# Patient Record
Sex: Female | Born: 1945 | Race: White | Hispanic: No | Smoking: Never smoker
Health system: Southern US, Community
[De-identification: ages and names within clinical notes are randomized; demographics above are authoritative.]

## PROBLEM LIST (undated history)

## (undated) DIAGNOSIS — E785 Hyperlipidemia, unspecified: Secondary | ICD-10-CM

## (undated) DIAGNOSIS — F419 Anxiety disorder, unspecified: Secondary | ICD-10-CM

## (undated) HISTORY — PX: LAPAROSCOPIC HYSTERECTOMY: SHX1926

## (undated) HISTORY — PX: OTHER SURGICAL HISTORY: SHX169

## (undated) HISTORY — PX: COLONOSCOPY: SHX174

## (undated) HISTORY — PX: BLADDER SUSPENSION: SHX72

---

## 2004-02-21 ENCOUNTER — Ambulatory Visit: Payer: Self-pay | Admitting: Internal Medicine

## 2005-02-23 ENCOUNTER — Ambulatory Visit: Payer: Self-pay | Admitting: Internal Medicine

## 2005-10-12 ENCOUNTER — Ambulatory Visit: Payer: Self-pay | Admitting: Unknown Physician Specialty

## 2006-03-09 ENCOUNTER — Ambulatory Visit: Payer: Self-pay | Admitting: Obstetrics and Gynecology

## 2007-04-12 ENCOUNTER — Ambulatory Visit: Payer: Self-pay | Admitting: Obstetrics and Gynecology

## 2008-04-12 ENCOUNTER — Ambulatory Visit: Payer: Self-pay | Admitting: Obstetrics and Gynecology

## 2009-05-13 ENCOUNTER — Ambulatory Visit: Payer: Self-pay | Admitting: Obstetrics and Gynecology

## 2010-07-25 ENCOUNTER — Ambulatory Visit: Payer: Self-pay | Admitting: Obstetrics and Gynecology

## 2011-07-31 ENCOUNTER — Ambulatory Visit: Payer: Self-pay | Admitting: Obstetrics and Gynecology

## 2012-07-06 ENCOUNTER — Ambulatory Visit: Payer: Self-pay | Admitting: Ophthalmology

## 2012-07-27 ENCOUNTER — Ambulatory Visit: Payer: Self-pay | Admitting: Ophthalmology

## 2012-08-01 ENCOUNTER — Ambulatory Visit: Payer: Self-pay | Admitting: Internal Medicine

## 2013-08-02 ENCOUNTER — Ambulatory Visit: Payer: Self-pay | Admitting: Internal Medicine

## 2014-08-06 ENCOUNTER — Ambulatory Visit: Payer: Self-pay | Admitting: Internal Medicine

## 2014-08-31 NOTE — Op Note (Signed)
PATIENT NAME:  Sharon Pittman, Sharon Pittman MR#:  [id number] DATE OF BIRTH:  09/29/1945  DATE OF PROCEDURE:  07/27/2012  PREOPERATIVE DIAGNOSIS:  Senile cataract, left eye.  POSTOPERATIVE DIAGNOSIS:  Senile cataract, left eye.  PROCEDURE:  Phacoemulsification with posterior chamber intraocular lens implantation of the left eye.  LENS:  ZCBOO 22.5-diopter posterior chamber intraocular lens.  ULTRASOUND TIME:  17 % of 1 minute, 7 seconds.  CDE 11.6.  SURGEON:  Mali Geniece Akers, MD  ANESTHESIA:  Topical with tetracaine drops and 2% Xylocaine jelly.  COMPLICATIONS:  None.  DESCRIPTION OF PROCEDURE:  The patient was identified in the holding room and transported to the operating room and placed in the supine position under the operating microscope.  The left eye was identified as the operative eye and it was prepped and draped in the usual sterile ophthalmic fashion.  A 1 millimeter clear-corneal paracentesis was made at the 3 o'clock position.  The anterior chamber was filled with Viscoat viscoelastic.  A 2.4 millimeter keratome was used to make a near-clear corneal incision at the 12 o'clock position.  A curvilinear capsulorrhexis was made with a cystotome and capsulorrhexis forceps.  Balanced salt solution was used to hydrodissect and hydrodelineate the nucleus.  Phacoemulsification was then used in stop and chop fashion to remove the lens nucleus and epinucleus.  The remaining cortex was then removed using the irrigation and aspiration handpiece. Provisc was then placed into the capsular bag to distend it for lens placement.  A ZCBOO 22.5-diopter lens was then injected into the capsular bag.  The remaining viscoelastic was aspirated.  Wounds were hydrated with balanced salt solution.  The anterior chamber was inflated to a physiologic pressure with balanced salt solution.  0.1 mL of cefuroxime 10 mg/mL were injected into the anterior chamber for a dose of 1 mg of intracameral antibiotic at the completion  of the case. Miostat was placed into the anterior chamber to constrict the pupil.  No wound leaks were noted.  Topical Vigamox drops and Maxitrol ointment were applied to the eye.  The patient was taken to the recovery room in stable condition without complications of anesthesia or surgery.     ____________________________ Wyonia Hough, MD crb:lg D: 07/27/2012 10:44:57 ET T: 07/27/2012 11:07:42 ET JOB#: [id number]  cc: Wyonia Hough, MD, <Dictator>

## 2015-03-25 ENCOUNTER — Other Ambulatory Visit: Payer: Self-pay | Admitting: Internal Medicine

## 2015-03-25 DIAGNOSIS — Z1231 Encounter for screening mammogram for malignant neoplasm of breast: Secondary | ICD-10-CM

## 2015-08-07 ENCOUNTER — Ambulatory Visit: Payer: Self-pay

## 2015-08-08 ENCOUNTER — Ambulatory Visit
Admission: RE | Admit: 2015-08-08 | Discharge: 2015-08-08 | Disposition: A | Discharge status: Home / Self Care | Payer: Medicare Other | Source: Ambulatory Visit | Attending: Internal Medicine | Admitting: Internal Medicine

## 2015-08-08 DIAGNOSIS — Z1231 Encounter for screening mammogram for malignant neoplasm of breast: Secondary | ICD-10-CM

## 2015-11-05 ENCOUNTER — Encounter: Payer: Self-pay | Admitting: *Deleted

## 2015-11-06 ENCOUNTER — Ambulatory Visit
Admission: RE | Admit: 2015-11-06 | Discharge: 2015-11-06 | Disposition: A | Discharge status: Home / Self Care | Payer: Medicare Other | Source: Ambulatory Visit | Attending: Unknown Physician Specialty | Admitting: Unknown Physician Specialty

## 2015-11-06 ENCOUNTER — Encounter: Payer: Self-pay | Admitting: *Deleted

## 2015-11-06 ENCOUNTER — Ambulatory Visit: Payer: Medicare Other | Admitting: Anesthesiology

## 2015-11-06 ENCOUNTER — Encounter
Admission: RE | Disposition: A | Discharge status: Home / Self Care | Payer: Self-pay | Source: Ambulatory Visit | Attending: Unknown Physician Specialty

## 2015-11-06 DIAGNOSIS — F419 Anxiety disorder, unspecified: Secondary | ICD-10-CM | POA: Diagnosis not present

## 2015-11-06 DIAGNOSIS — K621 Rectal polyp: Secondary | ICD-10-CM | POA: Insufficient documentation

## 2015-11-06 DIAGNOSIS — K640 First degree hemorrhoids: Secondary | ICD-10-CM | POA: Insufficient documentation

## 2015-11-06 DIAGNOSIS — Z79899 Other long term (current) drug therapy: Secondary | ICD-10-CM | POA: Diagnosis not present

## 2015-11-06 DIAGNOSIS — D125 Benign neoplasm of sigmoid colon: Secondary | ICD-10-CM | POA: Diagnosis not present

## 2015-11-06 DIAGNOSIS — Z7951 Long term (current) use of inhaled steroids: Secondary | ICD-10-CM | POA: Insufficient documentation

## 2015-11-06 DIAGNOSIS — Z9889 Other specified postprocedural states: Secondary | ICD-10-CM | POA: Diagnosis not present

## 2015-11-06 DIAGNOSIS — Z87448 Personal history of other diseases of urinary system: Secondary | ICD-10-CM | POA: Diagnosis not present

## 2015-11-06 DIAGNOSIS — Z1211 Encounter for screening for malignant neoplasm of colon: Secondary | ICD-10-CM | POA: Insufficient documentation

## 2015-11-06 DIAGNOSIS — K5730 Diverticulosis of large intestine without perforation or abscess without bleeding: Secondary | ICD-10-CM | POA: Insufficient documentation

## 2015-11-06 DIAGNOSIS — Z803 Family history of malignant neoplasm of breast: Secondary | ICD-10-CM | POA: Insufficient documentation

## 2015-11-06 HISTORY — DX: Hyperlipidemia, unspecified: E78.5

## 2015-11-06 HISTORY — DX: Anxiety disorder, unspecified: F41.9

## 2015-11-06 HISTORY — PX: COLONOSCOPY WITH PROPOFOL: SHX5780

## 2015-11-06 SURGERY — COLONOSCOPY WITH PROPOFOL
Anesthesia: General

## 2015-11-06 MED ORDER — MIDAZOLAM HCL 5 MG/5ML IJ SOLN
INTRAMUSCULAR | Status: DC | PRN
  Administered 2015-11-06: 1 mg via INTRAVENOUS

## 2015-11-06 MED ORDER — SODIUM CHLORIDE 0.9 % IV SOLN
INTRAVENOUS | Status: DC | PRN
  Administered 2015-11-06: 08:00:00 via INTRAVENOUS

## 2015-11-06 MED ORDER — PROPOFOL 500 MG/50ML IV EMUL
INTRAVENOUS | Status: DC | PRN
  Administered 2015-11-06: 160 ug/kg/min via INTRAVENOUS

## 2015-11-06 MED ORDER — LIDOCAINE 2% (20 MG/ML) 5 ML SYRINGE
INTRAMUSCULAR | Status: DC | PRN
  Administered 2015-11-06: 40 mg via INTRAVENOUS

## 2015-11-06 MED ORDER — GLYCOPYRROLATE 0.2 MG/ML IJ SOLN
INTRAMUSCULAR | Status: DC | PRN
  Administered 2015-11-06: 0.2 mg via INTRAVENOUS

## 2015-11-06 MED ORDER — FENTANYL CITRATE (PF) 100 MCG/2ML IJ SOLN
INTRAMUSCULAR | Status: DC | PRN
  Administered 2015-11-06: 50 ug via INTRAVENOUS

## 2015-11-06 MED ORDER — PROPOFOL 10 MG/ML IV BOLUS
INTRAVENOUS | Status: DC | PRN
  Administered 2015-11-06: 100 mg via INTRAVENOUS

## 2015-11-06 MED ORDER — SODIUM CHLORIDE 0.9 % IV SOLN: INTRAVENOUS | Status: DC

## 2015-11-06 MED ORDER — PHENYLEPHRINE HCL 10 MG/ML IJ SOLN
INTRAMUSCULAR | Status: DC | PRN
  Administered 2015-11-06 (×2): 100 ug via INTRAVENOUS

## 2015-11-06 NOTE — H&P (Signed)
   Primary Care Physician:  Rusty Aus, MD Primary Gastroenterologist:  Dr. Vira Agar  Pre-Procedure History & Physical: HPI:  Sharon Pittman is a 70 y.o. female is here for an colonoscopy.   Past Medical History  Diagnosis Date  . Anxiety   . Elevated lipids     Past Surgical History  Procedure Laterality Date  . Uretheral polyp      excision/ fulguration   . Colonoscopy      Prior to Admission medications   Medication Sig Start Date End Date Taking? Authorizing Provider  ALPRAZolam Duanne Moron) 0.25 MG tablet Take 0.25 mg by mouth at bedtime as needed for anxiety.   Yes Historical Provider, MD  calcium carbonate (OSCAL) 1500 (600 Ca) MG TABS tablet Take 1 tablet by mouth daily with breakfast.   Yes Historical Provider, MD  chlorthalidone (HYGROTON) 25 MG tablet Take 25 mg by mouth daily.   Yes Historical Provider, MD  fexofenadine (ALLEGRA) 180 MG tablet Take 180 mg by mouth daily.   Yes Historical Provider, MD  hydrocortisone (ANUSOL-HC) 25 MG suppository Place 25 mg rectally 2 (two) times daily.   Yes Historical Provider, MD  ibandronate (BONIVA) 150 MG tablet Take 150 mg by mouth every 30 (thirty) days. Take in the morning with a full glass of water, on an empty stomach, and do not take anything else by mouth or lie down for the next 30 min.   Yes Historical Provider, MD  mometasone (NASONEX) 50 MCG/ACT nasal spray Place 2 sprays into the nose daily.   Yes Historical Provider, MD    Allergies as of 10/17/2015  . (Not on File)    Family History  Problem Relation Age of Onset  . Breast cancer Maternal Grandmother 88    Social History   Social History  . Marital Status: Divorced    Spouse Name: N/A  . Number of Children: N/A  . Years of Education: N/A   Occupational History  . Not on file.   Social History Main Topics  . Smoking status: Never Smoker   . Smokeless tobacco: Not on file  . Alcohol Use: Not on file  . Drug Use: Not on file  . Sexual Activity: Not on  file   Other Topics Concern  . Not on file   Social History Narrative    Review of Systems: See HPI, otherwise negative ROS  Physical Exam: BP 140/78 mmHg  Pulse 76  Temp(Src) 98 F (36.7 C) (Tympanic)  Resp 18  Ht 5\' 3"  (1.6 m)  Wt 55.792 kg (123 lb)  BMI 21.79 kg/m2  SpO2 100% General:   Alert,  pleasant and cooperative in NAD Head:  Normocephalic and atraumatic. Neck:  Supple; no masses or thyromegaly. Lungs:  Clear throughout to auscultation.    Heart:  Regular rate and rhythm. Abdomen:  Soft, nontender and nondistended. Normal bowel sounds, without guarding, and without rebound.   Neurologic:  Alert and  oriented x4;  grossly normal neurologically.  Impression/Plan: Sharon Pittman is here for an colonoscopy to be performed for screening colonoscopy  Risks, benefits, limitations, and alternatives regarding  colonoscopy have been reviewed with the patient.  Questions have been answered.  All parties agreeable.   Gaylyn Cheers, MD  11/06/2015, 8:04 AM

## 2015-11-06 NOTE — Anesthesia Postprocedure Evaluation (Signed)
Anesthesia Post Note  Patient: Sharon Pittman  Procedure(s) Performed: Procedure(s) (LRB): COLONOSCOPY WITH PROPOFOL (N/A)  Patient location during evaluation: PACU Anesthesia Type: General Level of consciousness: awake and alert Pain management: pain level controlled Vital Signs Assessment: post-procedure vital signs reviewed and stable Respiratory status: spontaneous breathing, nonlabored ventilation, respiratory function stable and patient connected to nasal cannula oxygen Cardiovascular status: blood pressure returned to baseline and stable Postop Assessment: no signs of nausea or vomiting Anesthetic complications: no    Last Vitals:  Filed Vitals:   11/06/15 0900 11/06/15 0910  BP: 123/84 125/69  Pulse: 62 57  Temp:    Resp: 21 14    Last Pain: There were no vitals filed for this visit.               Molli Barrows

## 2015-11-06 NOTE — Op Note (Signed)
[facility] Gastroenterology Patient Name: Sharon Pittman Procedure Date: 11/06/2015 8:10 AM MRN: [id number] Account #: [id number] Date of Birth: 1946/01/15 Admit Type: Outpatient Age: 70 Room: [facility] ENDO ROOM 3 Gender: Female Note Status: Finalized Procedure:            Colonoscopy Indications:          Screening for colorectal malignant neoplasm Providers:            Manya Silvas, MD Referring MD:         Rusty Aus, MD (Referring MD) Medicines:            Propofol per Anesthesia Complications:        No immediate complications. Procedure:            Pre-Anesthesia Assessment:                       - After reviewing the risks and benefits, the patient                        was deemed in satisfactory condition to undergo the                        procedure.                       After obtaining informed consent, the colonoscope was                        passed under direct vision. Throughout the procedure,                        the patient's blood pressure, pulse, and oxygen                        saturations were monitored continuously. The                        Colonoscope was introduced through the anus and                        advanced to the the cecum, identified by appendiceal                        orifice and ileocecal valve. The colonoscopy was                        performed without difficulty. The patient tolerated the                        procedure well. The quality of the bowel preparation                        was excellent. Findings:      Five sessile polyps were found in the rectum. The polyps were diminutive       in size. These polyps were removed with a jumbo cold forceps. Resection       and retrieval were complete.      A small polyp was found in the sigmoid colon. The polyp was sessile. The       polyp was removed with a hot snare. Resection and retrieval were  complete.      The exam was otherwise without  abnormality.      Multiple small and large-mouthed diverticula were found in the sigmoid       colon, descending colon, transverse colon and ascending colon.      Internal hemorrhoids were found during endoscopy. The hemorrhoids were       small and Grade I (internal hemorrhoids that do not prolapse). Impression:           - Five diminutive polyps in the rectum, removed with a                        jumbo cold forceps. Resected and retrieved.                       - One small polyp in the sigmoid colon, removed with a                        hot snare. Resected and retrieved.                       - The examination was otherwise normal. Recommendation:       - Await pathology results. Manya Silvas, MD 11/06/2015 8:49:07 AM This report has been signed electronically. Number of Addenda: 0 Note Initiated On: 11/06/2015 8:10 AM Scope Withdrawal Time: 0 hours 17 minutes 58 seconds  Total Procedure Duration: 0 hours 25 minutes 21 seconds       [facility]

## 2015-11-06 NOTE — Transfer of Care (Signed)
Immediate Anesthesia Transfer of Care Note  Patient: Sharon Pittman  Procedure(s) Performed: Procedure(s): COLONOSCOPY WITH PROPOFOL (N/A)  Patient Location: PACU and Endoscopy Unit  Anesthesia Type:General  Level of Consciousness: awake, oriented and patient cooperative  Airway & Oxygen Therapy: Patient Spontanous Breathing and Patient connected to nasal cannula oxygen  Post-op Assessment: Report given to RN and Post -op Vital signs reviewed and stable  Post vital signs: stable  Last Vitals:  Filed Vitals:   11/06/15 0751  BP: 140/78  Pulse: 76  Temp: 36.7 C  Resp: 18    Last Pain: There were no vitals filed for this visit.       Complications: No apparent anesthesia complications

## 2015-11-06 NOTE — Anesthesia Preprocedure Evaluation (Signed)
Anesthesia Evaluation  Patient identified by MRN, date of birth, ID band Patient awake    Reviewed: Allergy & Precautions, H&P , NPO status , Patient's Chart, lab work & pertinent test results, reviewed documented beta blocker date and time   Airway Mallampati: II   Neck ROM: full    Dental  (+) Poor Dentition   Pulmonary neg pulmonary ROS,    Pulmonary exam normal        Cardiovascular negative cardio ROS Normal cardiovascular exam     Neuro/Psych negative neurological ROS  negative psych ROS   GI/Hepatic negative GI ROS, Neg liver ROS,   Endo/Other  negative endocrine ROS  Renal/GU negative Renal ROS  negative genitourinary   Musculoskeletal   Abdominal   Peds  Hematology negative hematology ROS (+)   Anesthesia Other Findings Past Medical History:   Anxiety                                                      Elevated lipids                                            Past Surgical History:   uretheral polyp                                                 Comment:excision/ fulguration    Reproductive/Obstetrics                             Anesthesia Physical Anesthesia Plan  ASA: III  Anesthesia Plan: General   Post-op Pain Management:    Induction:   Airway Management Planned:   Additional Equipment:   Intra-op Plan:   Post-operative Plan:   Informed Consent: I have reviewed the patients History and Physical, chart, labs and discussed the procedure including the risks, benefits and alternatives for the proposed anesthesia with the patient or authorized representative who has indicated his/her understanding and acceptance.   Dental Advisory Given  Plan Discussed with: CRNA  Anesthesia Plan Comments:         Anesthesia Quick Evaluation

## 2015-11-07 LAB — SURGICAL PATHOLOGY

## 2015-11-08 ENCOUNTER — Encounter: Payer: Self-pay | Admitting: Unknown Physician Specialty

## 2016-04-08 ENCOUNTER — Other Ambulatory Visit: Payer: Self-pay | Admitting: Internal Medicine

## 2016-04-08 DIAGNOSIS — Z1231 Encounter for screening mammogram for malignant neoplasm of breast: Secondary | ICD-10-CM

## 2016-08-11 ENCOUNTER — Ambulatory Visit
Admission: RE | Admit: 2016-08-11 | Discharge: 2016-08-11 | Disposition: A | Discharge status: Home / Self Care | Payer: Medicare Other | Source: Ambulatory Visit | Attending: Internal Medicine | Admitting: Internal Medicine

## 2016-08-11 DIAGNOSIS — Z1231 Encounter for screening mammogram for malignant neoplasm of breast: Secondary | ICD-10-CM | POA: Diagnosis not present

## 2017-07-05 ENCOUNTER — Other Ambulatory Visit: Payer: Self-pay | Admitting: Internal Medicine

## 2017-07-05 DIAGNOSIS — Z1231 Encounter for screening mammogram for malignant neoplasm of breast: Secondary | ICD-10-CM

## 2017-08-12 ENCOUNTER — Ambulatory Visit
Admission: RE | Admit: 2017-08-12 | Discharge: 2017-08-12 | Disposition: A | Discharge status: Home / Self Care | Payer: Medicare Other | Source: Ambulatory Visit | Attending: Internal Medicine | Admitting: Internal Medicine

## 2017-08-12 DIAGNOSIS — Z1231 Encounter for screening mammogram for malignant neoplasm of breast: Secondary | ICD-10-CM | POA: Insufficient documentation

## 2017-08-12 DIAGNOSIS — R928 Other abnormal and inconclusive findings on diagnostic imaging of breast: Secondary | ICD-10-CM | POA: Diagnosis not present

## 2017-08-16 ENCOUNTER — Other Ambulatory Visit: Payer: Self-pay | Admitting: Internal Medicine

## 2017-08-16 DIAGNOSIS — N6489 Other specified disorders of breast: Secondary | ICD-10-CM

## 2017-08-16 DIAGNOSIS — R928 Other abnormal and inconclusive findings on diagnostic imaging of breast: Secondary | ICD-10-CM

## 2017-08-31 ENCOUNTER — Ambulatory Visit
Admission: RE | Admit: 2017-08-31 | Discharge: 2017-08-31 | Disposition: A | Discharge status: Home / Self Care | Payer: Medicare Other | Source: Ambulatory Visit | Attending: Internal Medicine | Admitting: Internal Medicine

## 2017-08-31 DIAGNOSIS — R928 Other abnormal and inconclusive findings on diagnostic imaging of breast: Secondary | ICD-10-CM

## 2017-08-31 DIAGNOSIS — N6489 Other specified disorders of breast: Secondary | ICD-10-CM | POA: Insufficient documentation

## 2018-03-03 ENCOUNTER — Observation Stay
Admission: EM | Admit: 2018-03-03 | Discharge: 2018-03-04 | Disposition: A | Discharge status: Home / Self Care | Payer: Medicare Other | Attending: General Surgery | Admitting: General Surgery

## 2018-03-03 ENCOUNTER — Other Ambulatory Visit: Payer: Self-pay

## 2018-03-03 ENCOUNTER — Ambulatory Visit
Admission: RE | Admit: 2018-03-03 | Discharge: 2018-03-03 | Disposition: A | Discharge status: Home / Self Care | Payer: Medicare Other | Source: Ambulatory Visit | Attending: Family Medicine | Admitting: Family Medicine

## 2018-03-03 ENCOUNTER — Other Ambulatory Visit: Payer: Self-pay | Admitting: Family Medicine

## 2018-03-03 DIAGNOSIS — R1031 Right lower quadrant pain: Secondary | ICD-10-CM

## 2018-03-03 DIAGNOSIS — D72829 Elevated white blood cell count, unspecified: Secondary | ICD-10-CM | POA: Insufficient documentation

## 2018-03-03 DIAGNOSIS — I700 Atherosclerosis of aorta: Secondary | ICD-10-CM | POA: Insufficient documentation

## 2018-03-03 DIAGNOSIS — F419 Anxiety disorder, unspecified: Secondary | ICD-10-CM | POA: Diagnosis not present

## 2018-03-03 DIAGNOSIS — K5740 Diverticulitis of both small and large intestine with perforation and abscess without bleeding: Secondary | ICD-10-CM

## 2018-03-03 DIAGNOSIS — R509 Fever, unspecified: Secondary | ICD-10-CM

## 2018-03-03 DIAGNOSIS — Z7951 Long term (current) use of inhaled steroids: Secondary | ICD-10-CM | POA: Insufficient documentation

## 2018-03-03 DIAGNOSIS — R1032 Left lower quadrant pain: Principal | ICD-10-CM

## 2018-03-03 DIAGNOSIS — K5720 Diverticulitis of large intestine with perforation and abscess without bleeding: Secondary | ICD-10-CM | POA: Diagnosis not present

## 2018-03-03 DIAGNOSIS — K5780 Diverticulitis of intestine, part unspecified, with perforation and abscess without bleeding: Secondary | ICD-10-CM | POA: Diagnosis present

## 2018-03-03 DIAGNOSIS — K439 Ventral hernia without obstruction or gangrene: Secondary | ICD-10-CM

## 2018-03-03 DIAGNOSIS — Z79899 Other long term (current) drug therapy: Secondary | ICD-10-CM | POA: Insufficient documentation

## 2018-03-03 DIAGNOSIS — K5732 Diverticulitis of large intestine without perforation or abscess without bleeding: Secondary | ICD-10-CM | POA: Insufficient documentation

## 2018-03-03 LAB — COMPREHENSIVE METABOLIC PANEL
ALBUMIN: 3.6 g/dL (ref 3.5–5.0)
ALK PHOS: 73 U/L (ref 38–126)
ALT: 23 U/L (ref 0–44)
AST: 29 U/L (ref 15–41)
Anion gap: 8 (ref 5–15)
BILIRUBIN TOTAL: 0.6 mg/dL (ref 0.3–1.2)
BUN: 9 mg/dL (ref 8–23)
CALCIUM: 8.5 mg/dL — AB (ref 8.9–10.3)
CO2: 26 mmol/L (ref 22–32)
Chloride: 100 mmol/L (ref 98–111)
Creatinine, Ser: 0.63 mg/dL (ref 0.44–1.00)
GFR calc Af Amer: >60 mL/min (ref >60)
GLUCOSE: 104 mg/dL — AB (ref 70–99)
POTASSIUM: 3.6 mmol/L (ref 3.5–5.1)
Sodium: 134 mmol/L — ABNORMAL LOW (ref 135–145)
TOTAL PROTEIN: 6.7 g/dL (ref 6.5–8.1)

## 2018-03-03 LAB — CBC WITH DIFFERENTIAL/PLATELET
ABS IMMATURE GRANULOCYTES: 0.14 10*3/uL — AB (ref 0.00–0.07)
BASOS ABS: 0.1 10*3/uL (ref 0.0–0.1)
BASOS PCT: 1 %
Eosinophils Absolute: 0.1 10*3/uL (ref 0.0–0.5)
Eosinophils Relative: 1 %
HCT: 41.5 % (ref 36.0–46.0)
Hemoglobin: 13.5 g/dL (ref 12.0–15.0)
IMMATURE GRANULOCYTES: 1 %
Lymphocytes Relative: 15 %
Lymphs Abs: 1.9 10*3/uL (ref 0.7–4.0)
MCH: 28.8 pg (ref 26.0–34.0)
MCHC: 32.5 g/dL (ref 30.0–36.0)
MCV: 88.7 fL (ref 80.0–100.0)
Monocytes Absolute: 1.3 10*3/uL — ABNORMAL HIGH (ref 0.1–1.0)
Monocytes Relative: 10 %
NEUTROS ABS: 9.4 10*3/uL — AB (ref 1.7–7.7)
NEUTROS PCT: 72 %
NRBC: 0 % (ref 0.0–0.2)
Platelets: 264 10*3/uL (ref 150–400)
RBC: 4.68 MIL/uL (ref 3.87–5.11)
RDW: 13.9 % (ref 11.5–15.5)
WBC: 12.9 10*3/uL — AB (ref 4.0–10.5)

## 2018-03-03 LAB — TROPONIN I: Troponin I: <0.03 ng/mL (ref <0.03)

## 2018-03-03 LAB — LACTIC ACID, PLASMA: Lactic Acid, Venous: 0.8 mmol/L (ref 0.5–1.9)

## 2018-03-03 MED ORDER — ALPRAZOLAM 0.5 MG PO TABS: 0.2500 mg | ORAL_TABLET | Freq: Every evening | ORAL | Status: DC | PRN

## 2018-03-03 MED ORDER — PIPERACILLIN-TAZOBACTAM 3.375 G IVPB: 3.3750 g | Freq: Three times a day (TID) | INTRAVENOUS | Status: DC

## 2018-03-03 MED ORDER — PANTOPRAZOLE SODIUM 20 MG PO TBEC
20.0000 mg | DELAYED_RELEASE_TABLET | Freq: Every day | ORAL | Status: DC
  Administered 2018-03-03 – 2018-03-04 (×2): 20 mg via ORAL
  Filled 2018-03-03 (×2): qty 1

## 2018-03-03 MED ORDER — ENOXAPARIN SODIUM 40 MG/0.4ML ~~LOC~~ SOLN
40.0000 mg | SUBCUTANEOUS | Status: DC
  Administered 2018-03-03: 40 mg via SUBCUTANEOUS
  Filled 2018-03-03: qty 0.4

## 2018-03-03 MED ORDER — SODIUM CHLORIDE 0.9 % IV SOLN
INTRAVENOUS | Status: DC
  Administered 2018-03-03: 22:00:00 via INTRAVENOUS

## 2018-03-03 MED ORDER — METRONIDAZOLE IN NACL 5-0.79 MG/ML-% IV SOLN
500.0000 mg | Freq: Three times a day (TID) | INTRAVENOUS | Status: DC
  Filled 2018-03-03: qty 100

## 2018-03-03 MED ORDER — ACETAMINOPHEN 325 MG PO TABS: 650.0000 mg | ORAL_TABLET | Freq: Four times a day (QID) | ORAL | Status: DC | PRN

## 2018-03-03 MED ORDER — RISAQUAD PO CAPS
1.0000 | ORAL_CAPSULE | Freq: Three times a day (TID) | ORAL | Status: DC
  Administered 2018-03-03 – 2018-03-04 (×2): 1 via ORAL
  Filled 2018-03-03 (×2): qty 1

## 2018-03-03 MED ORDER — ACETAMINOPHEN 650 MG RE SUPP: 650.0000 mg | Freq: Four times a day (QID) | RECTAL | Status: DC | PRN

## 2018-03-03 MED ORDER — CALCIUM CARBONATE ANTACID 500 MG PO CHEW
3.0000 | CHEWABLE_TABLET | Freq: Every day | ORAL | Status: DC
  Administered 2018-03-04: 600 mg via ORAL
  Filled 2018-03-03: qty 3

## 2018-03-03 MED ORDER — SODIUM CHLORIDE 0.9 % IV SOLN
2.0000 g | Freq: Once | INTRAVENOUS | Status: AC
  Administered 2018-03-03: 2 g via INTRAVENOUS
  Filled 2018-03-03: qty 2

## 2018-03-03 MED ORDER — SODIUM CHLORIDE 0.9 % IV SOLN: 2.0000 g | Freq: Two times a day (BID) | INTRAVENOUS | Status: DC

## 2018-03-03 MED ORDER — IOPAMIDOL (ISOVUE-300) INJECTION 61%
100.0000 mL | Freq: Once | INTRAVENOUS | Status: AC | PRN
  Administered 2018-03-03: 100 mL via INTRAVENOUS

## 2018-03-03 MED ORDER — CHLORTHALIDONE 25 MG PO TABS
25.0000 mg | ORAL_TABLET | ORAL | Status: DC
Start: 2018-03-03 — End: 2018-03-04
  Filled 2018-03-03: qty 1

## 2018-03-03 MED ORDER — SODIUM CHLORIDE 0.9 % IV BOLUS
1000.0000 mL | Freq: Once | INTRAVENOUS | Status: AC
  Administered 2018-03-03: 1000 mL via INTRAVENOUS

## 2018-03-03 MED ORDER — PIPERACILLIN-TAZOBACTAM 3.375 G IVPB
3.3750 g | Freq: Three times a day (TID) | INTRAVENOUS | Status: DC
  Administered 2018-03-03 – 2018-03-04 (×2): 3.375 g via INTRAVENOUS
  Filled 2018-03-03 (×2): qty 50

## 2018-03-03 MED ORDER — HYDROCODONE-ACETAMINOPHEN 5-325 MG PO TABS: 1.0000 | ORAL_TABLET | ORAL | Status: DC | PRN

## 2018-03-03 NOTE — Consult Note (Signed)
Pharmacy Antibiotic Note  Sharon Pittman is a 72 y.o. female admitted on 03/03/2018 with intra-abdominal infection.  Pharmacy has been consulted for cefepime dosing.  Plan: Cefepime 2 gm IV every 12 hours  Height: 5\' 3"  (160 cm) Weight: 127 lb (57.6 kg) IBW/kg (Calculated) : 52.4  Temp (24hrs), Avg:97.8 F (36.6 C), Min:97.8 F (36.6 C), Max:97.8 F (36.6 C)  Recent Labs  Lab 03/03/18 1821  WBC 12.9*  CREATININE 0.63  LATICACIDVEN 0.8    Estimated Creatinine Clearance: 52.6 mL/min (by C-G formula based on SCr of 0.63 mg/dL).    Allergies  Allergen Reactions  . Ace Inhibitors Cough    Antimicrobials this admission: Cefepime 10/24 >>  Metronidazole 10/24 >>   Dose adjustments this admission:   Microbiology results:  BCx:   UCx:    Sputum:    MRSA PCR:   Thank you for allowing pharmacy to be a part of this patient's care.  Forrest Moron, PharmD 03/03/2018 7:08 PM

## 2018-03-03 NOTE — ED Notes (Signed)
Sam RN, aware of bed assigned  

## 2018-03-03 NOTE — ED Notes (Signed)
Pt sent from CT with a bowel per formation and abscess confirmed with a CT today. Pt ambulatory and in NAD at time of arrival to [employer].

## 2018-03-03 NOTE — ED Provider Notes (Signed)
[facility] Emergency Department Provider Note    First MD Initiated Contact with Patient 03/03/18 1809     (approximate)  I have reviewed the triage vital signs and the nursing notes.   HISTORY  Chief Complaint Abdominal Pain    HPI Sharon Pittman is a 72 y.o. female presents the ER with outpatient CT imaging showing evidence of significant diverticulitis with perforation and surrounding colonic edema and inflammation.  Patient states she is been having low-grade temperatures for the past week associated with diarrheal illness.  No antibiotics.  Denies any anterior abdominal pain but does have some achiness and bloating sensation in the left lower quadrant.   Has not taken anything for pain today.   Past Medical History:  Diagnosis Date  . Anxiety   . Elevated lipids    Family History  Problem Relation Age of Onset  . Breast cancer Maternal Grandmother 88   Past Surgical History:  Procedure Laterality Date  . COLONOSCOPY    . COLONOSCOPY WITH PROPOFOL N/A 11/06/2015   Procedure: COLONOSCOPY WITH PROPOFOL;  Surgeon: Manya Silvas, MD;  Location: [facility] ENDOSCOPY;  Service: Endoscopy;  Laterality: N/A;  . uretheral polyp     excision/ fulguration    There are no active problems to display for this patient.     Prior to Admission medications   Medication Sig Start Date End Date Taking? Authorizing Provider  ALPRAZolam Duanne Moron) 0.25 MG tablet Take 0.25 mg by mouth at bedtime as needed for anxiety.    [provider]  calcium carbonate (OSCAL) 1500 (600 Ca) MG TABS tablet Take 1 tablet by mouth daily with breakfast.    [provider]  chlorthalidone (HYGROTON) 25 MG tablet Take 25 mg by mouth daily.    [provider]  fexofenadine (ALLEGRA) 180 MG tablet Take 180 mg by mouth daily.    [provider]  hydrocortisone (ANUSOL-HC) 25 MG suppository Place 25 mg rectally 2 (two) times daily.    [provider]  ibandronate (BONIVA) 150 MG tablet Take 150 mg by mouth every 30 (thirty) days. Take in the morning with a full glass of water, on an empty stomach, and do not take anything else by mouth or lie down for the next 30 min.    [provider]  mometasone (NASONEX) 50 MCG/ACT nasal spray Place 2 sprays into the nose daily.    [provider]    Allergies Ace inhibitors    Social History Social History   Tobacco Use  . Smoking status: Never Smoker  . Smokeless tobacco: Never Used  Substance Use Topics  . Alcohol use: Not Currently  . Drug use: Not Currently    Review of Systems Patient denies headaches, rhinorrhea, blurry vision, numbness, shortness of breath, chest pain, edema, cough, abdominal pain, nausea, vomiting, diarrhea, dysuria, fevers, rashes or hallucinations unless otherwise stated above in HPI. ____________________________________________   PHYSICAL EXAM:  VITAL SIGNS: Vitals:   03/03/18 1824 03/03/18 1825  BP:    Pulse: 76 78  Resp:    Temp:    SpO2: 100% 100%    Constitutional: Alert and oriented.  Eyes: Conjunctivae are normal.  Head: Atraumatic. Nose: No congestion/rhinnorhea. Mouth/Throat: Mucous membranes are moist.   Neck: No stridor. Painless ROM.  Cardiovascular: Normal rate, regular rhythm. Grossly normal heart sounds.  Good peripheral circulation. Respiratory: Normal respiratory effort.  No retractions. Lungs CTAB. Gastrointestinal: Soft and nontender. No distention. No abdominal bruits. No CVA tenderness.  Genitourinary:  Musculoskeletal: No lower extremity tenderness nor edema.  No joint effusions. Neurologic:  Normal speech and language. No gross focal neurologic deficits are appreciated. No facial droop Skin:  Skin is warm, dry and intact. No rash noted. Psychiatric: Mood and affect are normal. Speech and behavior are normal.  ____________________________________________   LABS (all labs ordered are  listed, but only abnormal results are displayed)  Results for orders placed or performed during the hospital encounter of 03/03/18 (from the past 24 hour(s))  CBC with Differential/Platelet     Status: Abnormal   Collection Time: 03/03/18  6:21 PM  Result Value Ref Range   WBC 12.9 (H) 4.0 - 10.5 K/uL   RBC 4.68 3.87 - 5.11 MIL/uL   Hemoglobin 13.5 12.0 - 15.0 g/dL   HCT 41.5 36.0 - 46.0 %   MCV 88.7 80.0 - 100.0 fL   MCH 28.8 26.0 - 34.0 pg   MCHC 32.5 30.0 - 36.0 g/dL   RDW 13.9 11.5 - 15.5 %   Platelets 264 150 - 400 K/uL   nRBC 0.0 0.0 - 0.2 %   Neutrophils Relative % 72 %   Neutro Abs 9.4 (H) 1.7 - 7.7 K/uL   Lymphocytes Relative 15 %   Lymphs Abs 1.9 0.7 - 4.0 K/uL   Monocytes Relative 10 %   Monocytes Absolute 1.3 (H) 0.1 - 1.0 K/uL   Eosinophils Relative 1 %   Eosinophils Absolute 0.1 0.0 - 0.5 K/uL   Basophils Relative 1 %   Basophils Absolute 0.1 0.0 - 0.1 K/uL   Immature Granulocytes 1 %   Abs Immature Granulocytes 0.14 (H) 0.00 - 0.07 K/uL  Comprehensive metabolic panel     Status: Abnormal   Collection Time: 03/03/18  6:21 PM  Result Value Ref Range   Sodium 134 (L) 135 - 145 mmol/L   Potassium 3.6 3.5 - 5.1 mmol/L   Chloride 100 98 - 111 mmol/L   CO2 26 22 - 32 mmol/L   Glucose, Bld 104 (H) 70 - 99 mg/dL   BUN 9 8 - 23 mg/dL   Creatinine, Ser 0.63 0.44 - 1.00 mg/dL   Calcium 8.5 (L) 8.9 - 10.3 mg/dL   Total Protein 6.7 6.5 - 8.1 g/dL   Albumin 3.6 3.5 - 5.0 g/dL   AST 29 15 - 41 U/L   ALT 23 0 - 44 U/L   Alkaline Phosphatase 73 38 - 126 U/L   Total Bilirubin 0.6 0.3 - 1.2 mg/dL   GFR calc non Af Amer >60 >60 mL/min   GFR calc Af Amer >60 >60 mL/min   Anion gap 8 5 - 15  Troponin I     Status: None   Collection Time: 03/03/18  6:21 PM  Result Value Ref Range   Troponin I <0.03 <0.03 ng/mL   ____________________________________________ ____________________________________________  RADIOLOGY  I personally reviewed all radiographic images ordered to  evaluate for the above acute complaints and reviewed radiology reports and findings.  These findings were personally discussed with the patient.  Please see medical record for radiology report.  ____________________________________________   PROCEDURES  Procedure(s) performed:  Procedures    Critical Care performed: no ____________________________________________   INITIAL IMPRESSION / ASSESSMENT AND PLAN / ED COURSE  Pertinent labs & imaging results that were available during my care of the patient were reviewed by me and considered in my medical decision making (see chart for details).   DDX: diverticulitis, abscess, perforation, pyelo  Sharon Pittman is  a 72 y.o. who presents to the ED with evidence of perforated diverticulitis.  Patient with leukocytosis.  Given perforation discussed case with Dr. Peyton Najjar of general surgery who kindly agrees to admit patient for further management.  Patient will receive prospect of IV antibiotics as well as IV fluids.      As part of my medical decision making, I reviewed the following data within the [place] notes reviewed and incorporated, Labs reviewed, notes from prior ED visits and [place] Controlled Substance Database   ____________________________________________   FINAL CLINICAL IMPRESSION(S) / ED DIAGNOSES  Final diagnoses:  Diverticulitis of both small and large intestine with perforation and abscess      NEW MEDICATIONS STARTED DURING THIS VISIT:  New Prescriptions   No medications on file     Note:  This document was prepared using Dragon voice recognition software and may include unintentional dictation errors.    Merlyn Lot, MD 03/03/18 [phone]

## 2018-03-03 NOTE — ED Notes (Signed)
Report given to John RN.

## 2018-03-03 NOTE — H&P (Signed)
SURGICAL HISTORY AND PHYSICAL EXAM NOTE   HISTORY OF PRESENT ILLNESS (HPI):  72 y.o. female presented to [facility] ED for evaluation of abdominal pain, nausea and low grade fever. Patient reports that she has been feeling abdominal discomfort for a week with some episodes of diarrhea. Pain on lower quadrants. Pain does not radiates. There is no aggravator or alleviator factors. Patient tried Gas X without improvement of symptoms. Patient went to work today but did not felt well and went to her PCP. CBC was ordered and was found with leukocytosis. CT scan was ordered and she was found with diverticulitis of sigmoid colon with small 14 mm collection. I personally reviewed the images. Denies nausea or vomiting. Last meal was breakfast today.  Surgery is consulted by Dr. Quentin Cornwall in this context for evaluation and management of acute diverticulitis.  PAST MEDICAL HISTORY (PMH):  Past Medical History:  Diagnosis Date  . Anxiety   . Elevated lipids      PAST SURGICAL HISTORY ([place]):  Past Surgical History:  Procedure Laterality Date  . COLONOSCOPY    . COLONOSCOPY WITH PROPOFOL N/A 11/06/2015   Procedure: COLONOSCOPY WITH PROPOFOL;  Surgeon: Manya Silvas, MD;  Location: [facility] ENDOSCOPY;  Service: Endoscopy;  Laterality: N/A;  . uretheral polyp     excision/ fulguration      MEDICATIONS:  Prior to Admission medications   Medication Sig Start Date End Date Taking? Authorizing Provider  calcium carbonate (OSCAL) 1500 (600 Ca) MG TABS tablet Take 1 tablet by mouth daily with breakfast.   Yes [provider]  chlorthalidone (HYGROTON) 25 MG tablet Take 25 mg by mouth 2 (two) times a week.    Yes [provider]  ALPRAZolam (XANAX) 0.25 MG tablet Take 0.25 mg by mouth at bedtime as needed for anxiety.    [provider]  mometasone (NASONEX) 50 MCG/ACT nasal spray Place 2 sprays into the nose daily.     [provider]     ALLERGIES:  Allergies  Allergen  Reactions  . Ace Inhibitors Cough     SOCIAL HISTORY:  Social History   Socioeconomic History  . Marital status: Divorced    Spouse name: Not on file  . Number of children: Not on file  . Years of education: Not on file  . Highest education level: Not on file  Occupational History  . Not on file  Social Needs  . Financial resource strain: Not on file  . Food insecurity:    Worry: Not on file    Inability: Not on file  . Transportation needs:    Medical: Not on file    Non-medical: Not on file  Tobacco Use  . Smoking status: Never Smoker  . Smokeless tobacco: Never Used  Substance and Sexual Activity  . Alcohol use: Not Currently  . Drug use: Not Currently  . Sexual activity: Not Currently  Lifestyle  . Physical activity:    Days per week: Not on file    Minutes per session: Not on file  . Stress: Not on file  Relationships  . Social connections:    Talks on phone: Not on file    Gets together: Not on file    Attends religious service: Not on file    Active member of club or organization: Not on file    Attends meetings of clubs or organizations: Not on file    Relationship status: Not on file  . Intimate partner violence:    Fear of  current or ex partner: Not on file    Emotionally abused: Not on file    Physically abused: Not on file    Forced sexual activity: Not on file  Other Topics Concern  . Not on file  Social History Narrative  . Not on file    The patient currently resides (home / rehab facility / nursing home): Home The patient normally is (ambulatory / bedbound): Ambulatory   FAMILY HISTORY:  Family History  Problem Relation Age of Onset  . Breast cancer Maternal Grandmother 88     REVIEW OF SYSTEMS:  Constitutional: denies weight loss, fever, chills, or sweats  Eyes: denies any other vision changes, history of eye injury  ENT: denies sore throat, hearing problems  Respiratory: denies shortness of breath, wheezing  Cardiovascular: denies  chest pain, palpitations  Gastrointestinal: see HPI Genitourinary: denies burning with urination or urinary frequency Musculoskeletal: denies any other joint pains or cramps  Skin: denies any other rashes or skin discolorations  Neurological: denies any other headache, dizziness, weakness  Psychiatric: denies any other depression, anxiety   All other review of systems were negative   VITAL SIGNS:  Temp:  [97.8 F (36.6 C)] 97.8 F (36.6 C) (10/24 1814) Pulse Rate:  [76-81] 78 (10/24 1825) Resp:  [17] 17 (10/24 1814) BP: (172)/(87) 172/87 (10/24 1814) SpO2:  [100 %] 100 % (10/24 1825) Weight:  [57.6 kg] 57.6 kg (10/24 1815)     Height: 5\' 3"  (160 cm) Weight: 57.6 kg BMI (Calculated): 22.5   INTAKE/OUTPUT:  This shift: No intake/output data recorded.  Last 2 shifts: @IOLAST2SHIFTS @   PHYSICAL EXAM:  Constitutional:  -- Normal body habitus  -- Awake, alert, and oriented x3  Eyes:  -- Pupils equally round and reactive to light  -- No scleral icterus  Ear, nose, and throat:  -- No jugular venous distension  Pulmonary:  -- No crackles  -- Equal breath sounds bilaterally -- Breathing non-labored at rest Cardiovascular:  -- S1, S2 present  -- No pericardial rubs Gastrointestinal:  -- Abdomen soft, mild tender, non-distended, no guarding or rebound tenderness -- No abdominal masses appreciated, pulsatile or otherwise  Musculoskeletal and Integumentary:  -- Wounds or skin discoloration: None appreciated -- Extremities: B/L UE and LE FROM, hands and feet warm, no edema  Neurologic:  -- Motor function: intact and symmetric -- Sensation: intact and symmetric   Labs:  CBC Latest Ref Rng & Units 03/03/2018  WBC 4.0 - 10.5 K/uL 12.9(H)  Hemoglobin 12.0 - 15.0 g/dL 13.5  Hematocrit 36.0 - 46.0 % 41.5  Platelets 150 - 400 K/uL 264   CMP Latest Ref Rng & Units 03/03/2018  Glucose 70 - 99 mg/dL 104(H)  BUN 8 - 23 mg/dL 9  Creatinine 0.44 - 1.00 mg/dL 0.63  Sodium 135 - 145  mmol/L 134(L)  Potassium 3.5 - 5.1 mmol/L 3.6  Chloride 98 - 111 mmol/L 100  CO2 22 - 32 mmol/L 26  Calcium 8.9 - 10.3 mg/dL 8.5(L)  Total Protein 6.5 - 8.1 g/dL 6.7  Total Bilirubin 0.3 - 1.2 mg/dL 0.6  Alkaline Phos 38 - 126 U/L 73  AST 15 - 41 U/L 29  ALT 0 - 44 U/L 23   Imaging studies:  EXAM: CT ABDOMEN AND PELVIS WITH CONTRAST  TECHNIQUE: Multidetector CT imaging of the abdomen and pelvis was performed using the standard protocol following bolus administration of intravenous contrast.  CONTRAST:  159mL ISOVUE-300 IOPAMIDOL (ISOVUE-300) INJECTION 61%  COMPARISON:  None.  FINDINGS:  Lower chest: No acute abnormality.  Hepatobiliary: Liver segment 4A and segment 5 subcentimeter lucencies, likely cyst. Appendectomy. No biliary ductal dilatation.  Pancreas: Unremarkable. No pancreatic ductal dilatation or surrounding inflammatory changes.  Spleen: Normal in size without focal abnormality.  Adrenals/Urinary Tract: Adrenal glands are unremarkable. Right kidney interpolar cyst measuring 4.2 cm. Left kidney ectopia, inferiorly located, and malrotation. Kidneys are otherwise normal, without renal calculi, focal lesion, or hydronephrosis. Bladder is unremarkable.  Stomach/Bowel: No obstructive or inflammatory changes of the stomach, small bowel, or appendix. Severe extensive pan colonic diverticulosis.  Acute perforated diverticulitis within the retroperitoneal segment of descending colon. 14 mm air and fluid-filled collection adjacent to the colon (series 2, image 41). Surrounding left-sided retroperitoneal edema.  Vascular/Lymphatic: Aortic atherosclerosis. No enlarged abdominal or pelvic lymph nodes.  Reproductive: Uterus and bilateral adnexa are unremarkable.  Other: Small upper abdominal ventral midline hernia containing fat.  Musculoskeletal: No fracture is seen. Multilevel discogenic degenerative changes of the lumbar spine greatest at the  L5-S1 level with there is moderate loss of intervertebral disc space height.  IMPRESSION: 1. Acute perforated diverticulitis of the retroperitoneal segment of descending colon with 14 mm air and fluid-filled collection adjacent to the colon. Surrounding left-sided retroperitoneal edema. 2. Pancolonic diverticulosis. 3.  Aortic Atherosclerosis (ICD10-I70.0). 4. Small upper abdominal ventral midline hernia containing fat.  These results will be called to the ordering clinician or representative by the Radiologist Assistant, and communication documented in the PACS or zVision Dashboard.  Electronically Signed   By: Kristine Garbe M.D.   On: 03/03/2018 17:42   Assessment/Plan:  72 y.o. female with acute complicated diverticulitis with peri colonic abscess. Patient oriented about diagnosis and CT scan findings. She was oriented about what a complicated diverticulitis means. Due to the finding of leukocytosis and mild abdominal pain I consider that the patient will benefit of observation overnight with IV antibiotic therapy and follow up labs in the morning. That will also led me evaluate if she tolerates diet and will be able to tolerate oral antibiotics.  Patient agree with plan. Will admit and give IV antibiotic, will assess full liquid diet toleration and repeat labs in the morning.   Arnold Long, MD

## 2018-03-03 NOTE — ED Triage Notes (Signed)
Pt sent over from CT with perforated bowel. Pt states she had abd pain with diarrhea last week and that seemed to clear up but has just felt bloated and "blah" for the past several days,

## 2018-03-04 LAB — CBC
HCT: 37.4 % (ref 36.0–46.0)
Hemoglobin: 11.8 g/dL — ABNORMAL LOW (ref 12.0–15.0)
MCH: 28.8 pg (ref 26.0–34.0)
MCHC: 31.6 g/dL (ref 30.0–36.0)
MCV: 91.2 fL (ref 80.0–100.0)
PLATELETS: 260 10*3/uL (ref 150–400)
RBC: 4.1 MIL/uL (ref 3.87–5.11)
RDW: 14 % (ref 11.5–15.5)
WBC: 8.3 10*3/uL (ref 4.0–10.5)
nRBC: 0 % (ref 0.0–0.2)

## 2018-03-04 MED ORDER — AMOXICILLIN-POT CLAVULANATE 875-125 MG PO TABS: 1.0000 | ORAL_TABLET | Freq: Two times a day (BID) | ORAL | 0 refills | Status: DC

## 2018-03-04 MED ORDER — PANTOPRAZOLE SODIUM 20 MG PO TBEC: 20.0000 mg | DELAYED_RELEASE_TABLET | Freq: Every day | ORAL | 0 refills | Status: AC

## 2018-03-04 NOTE — Care Management Obs Status (Signed)
[facility] NOTIFICATION   Patient Details  Name: Sharon Pittman MRN: [id number] Date of Birth: June 10, 1945   Medicare Observation Status Notification Given:  (S) No(admitted obs less thand 24 hours)    Beverly Sessions, RN 03/04/2018, 10:47 AM

## 2018-03-04 NOTE — Discharge Instructions (Signed)
°  Diet: Resume home heart healthy regular diet.   Activity: Increase activity as tolerated, light activity and walking are encouraged. Do not drive or drink alcohol if taking narcotic pain medications.  Medications: Resume all home medications.   Take antibiotic as prescribed. Will prescribe an anti acid medication that will help with heartburn symptoms of antibiotics. Start taking pro biotics.   Call office [phone]) at any time if any questions, worsening pain, fevers/chills, bleeding, drainage from incision site, or other concerns.

## 2018-03-04 NOTE — Discharge Summary (Signed)
  Patient ID: MOE BRIER MRN: [id number] DOB/AGE: 01/12/1946 72 y.o.  Admit date: 03/03/2018 Discharge date: 03/04/2018   Discharge Diagnoses:  Active Problems:   Diverticulitis of intestine with abscess   Procedures: None  Hospital Course: Patient admitted to observation over night for evaluation of diet tolerance and response to antibiotics with follow up CBC. WBC count normalized. Patient continue without pain and tolerated diet.   Physical Exam  Constitutional: She is well-developed, well-nourished, and in no distress.  HENT:  Head: Normocephalic.  Cardiovascular: Normal rate and regular rhythm.  Pulmonary/Chest: Effort normal.  Abdominal: Soft. She exhibits no distension. There is no tenderness. There is no rebound.   CBC    Component Value Date/Time   WBC 8.3 03/04/2018 0501   RBC 4.10 03/04/2018 0501   HGB 11.8 (L) 03/04/2018 0501   HCT 37.4 03/04/2018 0501   PLT 260 03/04/2018 0501   MCV 91.2 03/04/2018 0501   MCH 28.8 03/04/2018 0501   MCHC 31.6 03/04/2018 0501   RDW 14.0 03/04/2018 0501   LYMPHSABS 1.9 03/03/2018 1821   MONOABS 1.3 (H) 03/03/2018 1821   EOSABS 0.1 03/03/2018 1821   BASOSABS 0.1 03/03/2018 1821   Consults: None  Disposition: Home  Discharge Instructions    Increase activity slowly   Complete by:  As directed      Allergies as of 03/04/2018      Reactions   Ace Inhibitors Cough      Medication List    TAKE these medications   ALPRAZolam 0.25 MG tablet Commonly known as:  XANAX Take 0.25 mg by mouth at bedtime as needed for anxiety.   amoxicillin-clavulanate 875-125 MG tablet Commonly known as:  AUGMENTIN Take 1 tablet by mouth 2 (two) times daily for 10 days.   calcium carbonate 1500 (600 Ca) MG Tabs tablet Commonly known as:  OSCAL Take 1 tablet by mouth daily with breakfast.   chlorthalidone 25 MG tablet Commonly known as:  HYGROTON Take 25 mg by mouth 2 (two) times a week.   mometasone 50 MCG/ACT nasal  spray Commonly known as:  NASONEX Place 2 sprays into the nose daily.   pantoprazole 20 MG tablet Commonly known as:  PROTONIX Take 1 tablet (20 mg total) by mouth daily for 20 days. Start taking on:  03/05/2018      Follow-up Information    Herbert Pun, MD Follow up in 2 week(s).   Specialty:  General Surgery Contact information: [address] [place] [address] [phone]

## 2018-03-07 ENCOUNTER — Other Ambulatory Visit: Payer: Self-pay | Admitting: Internal Medicine

## 2018-03-07 DIAGNOSIS — K5792 Diverticulitis of intestine, part unspecified, without perforation or abscess without bleeding: Secondary | ICD-10-CM

## 2018-03-07 DIAGNOSIS — K631 Perforation of intestine (nontraumatic): Secondary | ICD-10-CM

## 2018-03-07 DIAGNOSIS — R140 Abdominal distension (gaseous): Secondary | ICD-10-CM

## 2018-03-09 ENCOUNTER — Inpatient Hospital Stay
Admission: EM | Admit: 2018-03-09 | Discharge: 2018-03-11 | DRG: 392 | Disposition: A | Discharge status: Home / Self Care | Payer: Medicare Other | Attending: General Surgery | Admitting: General Surgery

## 2018-03-09 DIAGNOSIS — Z803 Family history of malignant neoplasm of breast: Secondary | ICD-10-CM

## 2018-03-09 DIAGNOSIS — Z79899 Other long term (current) drug therapy: Secondary | ICD-10-CM

## 2018-03-09 DIAGNOSIS — K5732 Diverticulitis of large intestine without perforation or abscess without bleeding: Secondary | ICD-10-CM | POA: Diagnosis not present

## 2018-03-09 DIAGNOSIS — K5720 Diverticulitis of large intestine with perforation and abscess without bleeding: Secondary | ICD-10-CM | POA: Diagnosis not present

## 2018-03-09 DIAGNOSIS — K5792 Diverticulitis of intestine, part unspecified, without perforation or abscess without bleeding: Secondary | ICD-10-CM

## 2018-03-09 DIAGNOSIS — F419 Anxiety disorder, unspecified: Secondary | ICD-10-CM | POA: Diagnosis present

## 2018-03-09 DIAGNOSIS — Z888 Allergy status to other drugs, medicaments and biological substances status: Secondary | ICD-10-CM

## 2018-03-09 LAB — CBC
HCT: 41.8 % (ref 36.0–46.0)
Hemoglobin: 13.6 g/dL (ref 12.0–15.0)
MCH: 28.8 pg (ref 26.0–34.0)
MCHC: 32.5 g/dL (ref 30.0–36.0)
MCV: 88.4 fL (ref 80.0–100.0)
NRBC: 0 % (ref 0.0–0.2)
PLATELETS: 359 10*3/uL (ref 150–400)
RBC: 4.73 MIL/uL (ref 3.87–5.11)
RDW: 13.2 % (ref 11.5–15.5)
WBC: 19.5 10*3/uL — ABNORMAL HIGH (ref 4.0–10.5)

## 2018-03-09 LAB — LIPASE, BLOOD: LIPASE: 50 U/L (ref 11–51)

## 2018-03-09 LAB — COMPREHENSIVE METABOLIC PANEL
ALBUMIN: 3.9 g/dL (ref 3.5–5.0)
ALT: 19 U/L (ref 0–44)
ANION GAP: 7 (ref 5–15)
AST: 19 U/L (ref 15–41)
Alkaline Phosphatase: 66 U/L (ref 38–126)
BUN: 8 mg/dL (ref 8–23)
CALCIUM: 8.9 mg/dL (ref 8.9–10.3)
CHLORIDE: 101 mmol/L (ref 98–111)
CO2: 26 mmol/L (ref 22–32)
Creatinine, Ser: 0.63 mg/dL (ref 0.44–1.00)
GFR calc non Af Amer: >60 mL/min (ref >60)
GLUCOSE: 112 mg/dL — AB (ref 70–99)
Potassium: 3.5 mmol/L (ref 3.5–5.1)
SODIUM: 134 mmol/L — AB (ref 135–145)
TOTAL PROTEIN: 6.9 g/dL (ref 6.5–8.1)
Total Bilirubin: 0.4 mg/dL (ref 0.3–1.2)

## 2018-03-09 LAB — URINALYSIS, COMPLETE (UACMP) WITH MICROSCOPIC
BILIRUBIN URINE: NEGATIVE
Bacteria, UA: NONE SEEN
Glucose, UA: NEGATIVE mg/dL
Hgb urine dipstick: NEGATIVE
KETONES UR: NEGATIVE mg/dL
LEUKOCYTES UA: NEGATIVE
Nitrite: NEGATIVE
PH: 6 (ref 5.0–8.0)
Protein, ur: NEGATIVE mg/dL
Specific Gravity, Urine: 1.011 (ref 1.005–1.030)

## 2018-03-09 MED ORDER — MORPHINE SULFATE (PF) 2 MG/ML IV SOLN
2.0000 mg | Freq: Once | INTRAVENOUS | Status: AC
  Administered 2018-03-10: 2 mg via INTRAVENOUS
  Filled 2018-03-09: qty 1

## 2018-03-09 MED ORDER — ONDANSETRON HCL 4 MG/2ML IJ SOLN
4.0000 mg | Freq: Once | INTRAMUSCULAR | Status: AC
  Administered 2018-03-10: 4 mg via INTRAVENOUS
  Filled 2018-03-09: qty 2

## 2018-03-09 NOTE — ED Provider Notes (Signed)
[facility] Emergency Department Provider Note    First MD Initiated Contact with Patient 03/09/18 2345     (approximate)  I have reviewed the triage vital signs and the nursing notes.   HISTORY  Chief Complaint Abdominal Pain    HPI Sharon Pittman is a 72 y.o. female with below list of chronic medical conditions including recently diagnosed diverticulitis with perforation and abscess last Thursday returns to the emergency department with worsening pain.  Patient denies any fever.  Patient denies any nausea or vomiting.  Patient states current pain score is 9 out of 10.   Past Medical History:  Diagnosis Date  . Anxiety   . Elevated lipids     Patient Active Problem List   Diagnosis Date Noted  . Acute diverticulitis 03/10/2018  . Diverticulitis of intestine with abscess 03/03/2018    Past Surgical History:  Procedure Laterality Date  . COLONOSCOPY    . COLONOSCOPY WITH PROPOFOL N/A 11/06/2015   Procedure: COLONOSCOPY WITH PROPOFOL;  Surgeon: Manya Silvas, MD;  Location: [facility] ENDOSCOPY;  Service: Endoscopy;  Laterality: N/A;  . uretheral polyp     excision/ fulguration     Prior to Admission medications   Medication Sig Start Date End Date Taking? Authorizing Provider  ALPRAZolam Duanne Moron) 0.25 MG tablet Take 0.25 mg by mouth at bedtime as needed for anxiety.   Yes [provider]  amoxicillin-clavulanate (AUGMENTIN) 875-125 MG tablet Take 1 tablet by mouth 2 (two) times daily for 10 days. 03/04/18 03/14/18 Yes Herbert Pun, MD  calcium carbonate (OSCAL) 1500 (600 Ca) MG TABS tablet Take 1 tablet by mouth daily with breakfast.   Yes [provider]  chlorthalidone (HYGROTON) 25 MG tablet Take 25 mg by mouth 2 (two) times a week.    Yes [provider]  mometasone (NASONEX) 50 MCG/ACT nasal spray Place 2 sprays into the nose daily.    Yes [provider]  pantoprazole (PROTONIX) 20 MG tablet Take 1  tablet (20 mg total) by mouth daily for 20 days. 03/05/18 03/25/18 Yes Herbert Pun, MD    Allergies Ace inhibitors  Family History  Problem Relation Age of Onset  . Breast cancer Maternal Grandmother 88    Social History Social History   Tobacco Use  . Smoking status: Never Smoker  . Smokeless tobacco: Never Used  Substance Use Topics  . Alcohol use: Not Currently  . Drug use: Not Currently    Review of Systems Constitutional: No fever/chills Eyes: No visual changes. ENT: No sore throat. Cardiovascular: Denies chest pain. Respiratory: Denies shortness of breath. Gastrointestinal: Positive for abdominal pain.  No nausea, no vomiting.  No diarrhea.  No constipation. Genitourinary: Negative for dysuria. Musculoskeletal: Negative for neck pain.  Negative for back pain. Integumentary: Negative for rash. Neurological: Negative for headaches, focal weakness or numbness.  ____________________________________________   PHYSICAL EXAM:  VITAL SIGNS: ED Triage Vitals  Enc Vitals Group     BP 03/09/18 1929 (!) 171/81     Pulse Rate 03/09/18 1929 82     Resp 03/09/18 1929 18     Temp 03/09/18 1929 98.3 F (36.8 C)     Temp Source 03/09/18 1929 Oral     SpO2 03/09/18 1929 100 %     Weight 03/09/18 1930 57 kg (125 lb 10.6 oz)     Height --      Head Circumference --      Peak Flow --  Pain Score 03/09/18 1930 9     Pain Loc --      Pain Edu? --      Excl. in [place]? --     Constitutional: Alert and oriented.  Apparent discomfort  eyes: Conjunctivae are normal.  Head: Atraumatic. Mouth/Throat: Mucous membranes are moist.  Oropharynx non-erythematous. Neck: No stridor.   Cardiovascular: Normal rate, regular rhythm. Good peripheral circulation. Grossly normal heart sounds. Respiratory: Normal respiratory effort.  No retractions. Lungs CTAB. Gastrointestinal: Lower quadrant tenderness to palpation.. No distention.  Musculoskeletal: No lower extremity  tenderness nor edema. No gross deformities of extremities. Neurologic:  Normal speech and language. No gross focal neurologic deficits are appreciated.  Skin:  Skin is warm, dry and intact. No rash noted. Psychiatric: Mood and affect are normal. Speech and behavior are normal.  ____________________________________________   LABS (all labs ordered are listed, but only abnormal results are displayed)  Labs Reviewed  COMPREHENSIVE METABOLIC PANEL - Abnormal; Notable for the following components:      Result Value   Sodium 134 (*)    Glucose, Bld 112 (*)    All other components within normal limits  CBC - Abnormal; Notable for the following components:   WBC 19.5 (*)    All other components within normal limits  URINALYSIS, COMPLETE (UACMP) WITH MICROSCOPIC - Abnormal; Notable for the following components:   Color, Urine YELLOW (*)    APPearance CLEAR (*)    All other components within normal limits  LIPASE, BLOOD  BASIC METABOLIC PANEL  MAGNESIUM  CBC WITH DIFFERENTIAL/PLATELET     RADIOLOGY I, Fielding N Damarie Schoolfield, personally viewed and evaluated these images (plain radiographs) as part of my medical decision making, as well as reviewing the written report by the radiologist.  ED MD interpretation: Acute diverticulitis with increase in inflammation in the area previously noted on CT scan.  Official radiology report(s): Ct Abdomen Pelvis W Contrast  Result Date: 03/10/2018 CLINICAL DATA:  Left lower quadrant pain. Diagnosed with diverticulitis last Thursday. EXAM: CT ABDOMEN AND PELVIS WITH CONTRAST TECHNIQUE: Multidetector CT imaging of the abdomen and pelvis was performed using the standard protocol following bolus administration of intravenous contrast. CONTRAST:  130mL ISOVUE-300 IOPAMIDOL (ISOVUE-300) INJECTION 61% COMPARISON:  03/03/2018 FINDINGS: Lower chest: Lung bases are within normal.  Mild cardiomegaly. Hepatobiliary: Contracted gallbladder with cholelithiasis.  Subcentimeter hypodensity over the right lobe of the liver unchanged likely a cyst. Biliary tree is within normal. Pancreas: Normal. Spleen: Normal. Adrenals/Urinary Tract: Adrenal glands are normal. Kidneys are normal in size without hydronephrosis. No nephrolithiasis. 3.9 cm right renal cyst. Left kidney has hilum oriented anteriorly. Ureters and bladder are normal. Stomach/Bowel: Small sliding hiatal hernia. Stomach is otherwise unremarkable. Small bowel is within normal. Appendix is normal. Moderate diverticulosis of the colon. There is persistent acute diverticulitis involving the descending colon with slightly worse inflammatory change with associated wall thickening. No definite perforation or diverticular abscess. Mild adjacent free fluid. Vascular/Lymphatic: Minimal calcified plaque involving the abdominal aorta. No adenopathy. Reproductive: Normal. Other: None. Musculoskeletal: Degenerative change of the spine. IMPRESSION: Evidence of patient's known acute diverticulitis of the descending colon with slightly worse adjacent inflammatory change and possibly slightly worse wall thickening. No evidence of perforation or diverticular abscess. 3.9 cm right renal cyst. Subcentimeter right liver hypodensity too small to characterize but likely a cyst. Cholelithiasis. Aortic Atherosclerosis (ICD10-I70.0). Electronically Signed   By: Marin Olp M.D.   On: 03/10/2018 00:59      Procedures   ____________________________________________  INITIAL IMPRESSION / ASSESSMENT AND PLAN / ED COURSE  As part of my medical decision making, I reviewed the following data within the electronic MEDICAL RECORD NUMBER   72 year old female with above-stated history and physical exam secondary to worsening abdominal pain in the area of noted diverticulitis recently diagnosed.  Concern for perforation versus abscess formation versus failed outpatient management with Augmentin and as such CT abdomen was performed which  revealed findings concerning for worsening inflammation and thickening in the area of known diverticulitis.  Patient discussed with Dr. Hampton Abbot general surgeon on-call for hospital admission for further management.  Patient given IV Cipro and Flagyl in the emergency department ____________________________________________  FINAL CLINICAL IMPRESSION(S) / ED DIAGNOSES  Final diagnoses:  Acute diverticulitis     MEDICATIONS GIVEN DURING THIS VISIT:  Medications  metroNIDAZOLE (FLAGYL) IVPB 500 mg (has no administration in time range)  acetaminophen (TYLENOL) tablet 1,000 mg (has no administration in time range)  HYDROmorphone (DILAUDID) injection 0.5 mg (has no administration in time range)  ondansetron (ZOFRAN-ODT) disintegrating tablet 4 mg (has no administration in time range)    Or  ondansetron (ZOFRAN) injection 4 mg (has no administration in time range)  famotidine (PEPCID) IVPB 20 mg premix (has no administration in time range)  enoxaparin (LOVENOX) injection 40 mg (has no administration in time range)  lactated ringers infusion (has no administration in time range)  ciprofloxacin (CIPRO) IVPB 400 mg (has no administration in time range)  metroNIDAZOLE (FLAGYL) IVPB 500 mg (has no administration in time range)  ketorolac (TORADOL) 15 MG/ML injection 15 mg (has no administration in time range)  morphine 2 MG/ML injection 2 mg (2 mg Intravenous Given 03/10/18 0008)  ondansetron (ZOFRAN) injection 4 mg (4 mg Intravenous Given 03/10/18 0008)  iopamidol (ISOVUE-300) 61 % injection 100 mL (100 mLs Intravenous Contrast Given 03/10/18 0019)  ciprofloxacin (CIPRO) IVPB 400 mg (0 mg Intravenous Stopped 03/10/18 0347)     ED Discharge Orders    None       Note:  This document was prepared using Dragon voice recognition software and may include unintentional dictation errors.    Gregor Hams, MD 03/10/18 [phone]

## 2018-03-09 NOTE — ED Triage Notes (Signed)
Patient c/o LLQ abdominal pain. Patient was dx with diverticulitis in this ED last Thursday. Patient was told to return for worsening or new symptoms. Patient was RX Augmentin, Protonix.

## 2018-03-10 ENCOUNTER — Emergency Department: Payer: Medicare Other

## 2018-03-10 ENCOUNTER — Other Ambulatory Visit: Payer: Self-pay

## 2018-03-10 DIAGNOSIS — F419 Anxiety disorder, unspecified: Secondary | ICD-10-CM | POA: Diagnosis present

## 2018-03-10 DIAGNOSIS — Z888 Allergy status to other drugs, medicaments and biological substances status: Secondary | ICD-10-CM | POA: Diagnosis not present

## 2018-03-10 DIAGNOSIS — Z79899 Other long term (current) drug therapy: Secondary | ICD-10-CM | POA: Diagnosis not present

## 2018-03-10 DIAGNOSIS — K5720 Diverticulitis of large intestine with perforation and abscess without bleeding: Secondary | ICD-10-CM | POA: Diagnosis present

## 2018-03-10 DIAGNOSIS — K5732 Diverticulitis of large intestine without perforation or abscess without bleeding: Secondary | ICD-10-CM | POA: Diagnosis present

## 2018-03-10 DIAGNOSIS — K5792 Diverticulitis of intestine, part unspecified, without perforation or abscess without bleeding: Secondary | ICD-10-CM | POA: Diagnosis present

## 2018-03-10 DIAGNOSIS — Z803 Family history of malignant neoplasm of breast: Secondary | ICD-10-CM | POA: Diagnosis not present

## 2018-03-10 LAB — CBC WITH DIFFERENTIAL/PLATELET
Abs Immature Granulocytes: 0.15 10*3/uL — ABNORMAL HIGH (ref 0.00–0.07)
BASOS ABS: 0.1 10*3/uL (ref 0.0–0.1)
Basophils Relative: 1 %
EOS ABS: 0.1 10*3/uL (ref 0.0–0.5)
Eosinophils Relative: 0 %
HCT: 41.4 % (ref 36.0–46.0)
HEMOGLOBIN: 13.4 g/dL (ref 12.0–15.0)
IMMATURE GRANULOCYTES: 1 %
LYMPHS ABS: 1.6 10*3/uL (ref 0.7–4.0)
LYMPHS PCT: 8 %
MCH: 28.6 pg (ref 26.0–34.0)
MCHC: 32.4 g/dL (ref 30.0–36.0)
MCV: 88.5 fL (ref 80.0–100.0)
Monocytes Absolute: 1.2 10*3/uL — ABNORMAL HIGH (ref 0.1–1.0)
Monocytes Relative: 6 %
NEUTROS PCT: 84 %
NRBC: 0 % (ref 0.0–0.2)
Neutro Abs: 16.4 10*3/uL — ABNORMAL HIGH (ref 1.7–7.7)
Platelets: 341 10*3/uL (ref 150–400)
RBC: 4.68 MIL/uL (ref 3.87–5.11)
RDW: 13.3 % (ref 11.5–15.5)
WBC: 19.5 10*3/uL — ABNORMAL HIGH (ref 4.0–10.5)

## 2018-03-10 LAB — BASIC METABOLIC PANEL
Anion gap: 5 (ref 5–15)
BUN: 6 mg/dL — ABNORMAL LOW (ref 8–23)
CHLORIDE: 102 mmol/L (ref 98–111)
CO2: 28 mmol/L (ref 22–32)
Calcium: 8.6 mg/dL — ABNORMAL LOW (ref 8.9–10.3)
Creatinine, Ser: 0.69 mg/dL (ref 0.44–1.00)
GFR calc Af Amer: >60 mL/min (ref >60)
GFR calc non Af Amer: >60 mL/min (ref >60)
GLUCOSE: 95 mg/dL (ref 70–99)
POTASSIUM: 4 mmol/L (ref 3.5–5.1)
Sodium: 135 mmol/L (ref 135–145)

## 2018-03-10 LAB — MAGNESIUM: Magnesium: 2 mg/dL (ref 1.7–2.4)

## 2018-03-10 MED ORDER — FAMOTIDINE IN NACL 20-0.9 MG/50ML-% IV SOLN
20.0000 mg | Freq: Two times a day (BID) | INTRAVENOUS | Status: DC
  Administered 2018-03-10 (×2): 20 mg via INTRAVENOUS
  Filled 2018-03-10 (×2): qty 50

## 2018-03-10 MED ORDER — LACTATED RINGERS IV SOLN
INTRAVENOUS | Status: DC
  Administered 2018-03-10 (×2): via INTRAVENOUS

## 2018-03-10 MED ORDER — IOPAMIDOL (ISOVUE-300) INJECTION 61%
100.0000 mL | Freq: Once | INTRAVENOUS | Status: AC | PRN
  Administered 2018-03-10: 100 mL via INTRAVENOUS

## 2018-03-10 MED ORDER — ENOXAPARIN SODIUM 40 MG/0.4ML ~~LOC~~ SOLN
40.0000 mg | SUBCUTANEOUS | Status: DC
  Filled 2018-03-10: qty 0.4

## 2018-03-10 MED ORDER — METRONIDAZOLE IN NACL 5-0.79 MG/ML-% IV SOLN
500.0000 mg | Freq: Once | INTRAVENOUS | Status: AC
  Administered 2018-03-10: 500 mg via INTRAVENOUS

## 2018-03-10 MED ORDER — METRONIDAZOLE IN NACL 5-0.79 MG/ML-% IV SOLN
500.0000 mg | Freq: Three times a day (TID) | INTRAVENOUS | Status: DC
  Administered 2018-03-10 – 2018-03-11 (×4): 500 mg via INTRAVENOUS
  Filled 2018-03-10 (×7): qty 100

## 2018-03-10 MED ORDER — CIPROFLOXACIN IN D5W 400 MG/200ML IV SOLN
400.0000 mg | Freq: Two times a day (BID) | INTRAVENOUS | Status: DC
  Administered 2018-03-10 – 2018-03-11 (×2): 400 mg via INTRAVENOUS
  Filled 2018-03-10 (×4): qty 200

## 2018-03-10 MED ORDER — CIPROFLOXACIN IN D5W 400 MG/200ML IV SOLN
400.0000 mg | Freq: Once | INTRAVENOUS | Status: AC
  Administered 2018-03-10: 400 mg via INTRAVENOUS

## 2018-03-10 MED ORDER — HYDROMORPHONE HCL 1 MG/ML IJ SOLN: 0.5000 mg | INTRAMUSCULAR | Status: DC | PRN

## 2018-03-10 MED ORDER — KETOROLAC TROMETHAMINE 15 MG/ML IJ SOLN
15.0000 mg | Freq: Four times a day (QID) | INTRAMUSCULAR | Status: DC
  Administered 2018-03-10: 15 mg via INTRAVENOUS
  Filled 2018-03-10 (×3): qty 1

## 2018-03-10 MED ORDER — ACETAMINOPHEN 500 MG PO TABS
1000.0000 mg | ORAL_TABLET | Freq: Four times a day (QID) | ORAL | Status: DC
  Administered 2018-03-10: 1000 mg via ORAL
  Filled 2018-03-10 (×3): qty 2

## 2018-03-10 MED ORDER — ONDANSETRON HCL 4 MG/2ML IJ SOLN: 4.0000 mg | Freq: Four times a day (QID) | INTRAMUSCULAR | Status: DC | PRN

## 2018-03-10 MED ORDER — ENOXAPARIN SODIUM 40 MG/0.4ML ~~LOC~~ SOLN: 40.0000 mg | SUBCUTANEOUS | Status: DC

## 2018-03-10 MED ORDER — ONDANSETRON 4 MG PO TBDP: 4.0000 mg | ORAL_TABLET | Freq: Four times a day (QID) | ORAL | Status: DC | PRN

## 2018-03-10 NOTE — H&P (Signed)
SURGICAL HISTORY AND PHYSICAL NOTE   HISTORY OF PRESENT ILLNESS (HPI):  72 y.o. female presented to [facility] ED for evaluation of abdominal pain since yesterday. Patient reports she was doing great this week and was even able to work yesterday during the day, but at night she started with abdominal pain. At fist she thought it was gas and walked and pain improved, but the pain recurred and decided to come to ED. Patient refers pain localized to left lower quadrant. Pain improved with pain medication and antibiotic given in ED. No alleviator factor.  Denies fever or chills. Denies nausea, or vomiting. Denies diarrhea.  She had a similar episode last week. CT scan done and showed diverticulitis with small abscess. She was treated with IV antibiotic and responded well. She tolerated diet, normal WBC count and discharged on oral antibiotic.   Now on ED a new CT scan was done that shows no abscess but with increased wall inflammation. Images were personally reviewed. WBC count 19 thousand this morning. Pain is controlled.   Surgery is consulted by Dr. Owens Shark in this context for evaluation and management of acute diverticulitis.  PAST MEDICAL HISTORY (PMH):  Past Medical History:  Diagnosis Date  . Anxiety   . Elevated lipids      PAST SURGICAL HISTORY ([place]):  Past Surgical History:  Procedure Laterality Date  . COLONOSCOPY    . COLONOSCOPY WITH PROPOFOL N/A 11/06/2015   Procedure: COLONOSCOPY WITH PROPOFOL;  Surgeon: Manya Silvas, MD;  Location: [facility] ENDOSCOPY;  Service: Endoscopy;  Laterality: N/A;  . uretheral polyp     excision/ fulguration      MEDICATIONS:  Prior to Admission medications   Medication Sig Start Date End Date Taking? Authorizing Provider  ALPRAZolam Duanne Moron) 0.25 MG tablet Take 0.25 mg by mouth at bedtime as needed for anxiety.   Yes [provider]  amoxicillin-clavulanate (AUGMENTIN) 875-125 MG tablet Take 1 tablet by mouth 2 (two) times daily for 10 days.  03/04/18 03/14/18 Yes Herbert Pun, MD  calcium carbonate (OSCAL) 1500 (600 Ca) MG TABS tablet Take 1 tablet by mouth daily with breakfast.   Yes [provider]  chlorthalidone (HYGROTON) 25 MG tablet Take 25 mg by mouth 2 (two) times a week.    Yes [provider]  mometasone (NASONEX) 50 MCG/ACT nasal spray Place 2 sprays into the nose daily.    Yes [provider]  pantoprazole (PROTONIX) 20 MG tablet Take 1 tablet (20 mg total) by mouth daily for 20 days. 03/05/18 03/25/18 Yes Herbert Pun, MD     ALLERGIES:  Allergies  Allergen Reactions  . Ace Inhibitors Cough     SOCIAL HISTORY:  Social History   Socioeconomic History  . Marital status: Divorced    Spouse name: Not on file  . Number of children: Not on file  . Years of education: Not on file  . Highest education level: Not on file  Occupational History  . Not on file  Social Needs  . Financial resource strain: Not on file  . Food insecurity:    Worry: Not on file    Inability: Not on file  . Transportation needs:    Medical: Not on file    Non-medical: Not on file  Tobacco Use  . Smoking status: Never Smoker  . Smokeless tobacco: Never Used  Substance and Sexual Activity  . Alcohol use: Not Currently  . Drug use: Not Currently  . Sexual activity: Not Currently  Lifestyle  .  Physical activity:    Days per week: Not on file    Minutes per session: Not on file  . Stress: Not on file  Relationships  . Social connections:    Talks on phone: Not on file    Gets together: Not on file    Attends religious service: Not on file    Active member of club or organization: Not on file    Attends meetings of clubs or organizations: Not on file    Relationship status: Not on file  . Intimate partner violence:    Fear of current or ex partner: Not on file    Emotionally abused: Not on file    Physically abused: Not on file    Forced sexual activity: Not on file  Other Topics  Concern  . Not on file  Social History Narrative  . Not on file    The patient currently resides (home / rehab facility / nursing home): Home The patient normally is (ambulatory / bedbound): Ambulatory   FAMILY HISTORY:  Family History  Problem Relation Age of Onset  . Breast cancer Maternal Grandmother 88     REVIEW OF SYSTEMS:  Constitutional: denies weight loss, fever, chills, or sweats  Eyes: denies any other vision changes, history of eye injury  ENT: denies sore throat, hearing problems  Respiratory: denies shortness of breath, wheezing  Cardiovascular: denies chest pain, palpitations  Gastrointestinal: positive abdominal pain. Negative nausea, vomiting or diarrhea Genitourinary: denies burning with urination or urinary frequency Musculoskeletal: denies any other joint pains or cramps  Skin: denies any other rashes or skin discolorations  Neurological: denies any other headache, dizziness, weakness  Psychiatric: denies any other depression, anxiety   All other review of systems were negative   VITAL SIGNS:  Temp:  [98.1 F (36.7 C)-98.6 F (37 C)] 98.3 F (36.8 C) (10/31 0435) Pulse Rate:  [16-82] 72 (10/31 0435) Resp:  [16-18] 16 (10/31 0358) BP: (121-171)/(57-87) 145/71 (10/31 0435) SpO2:  [95 %-100 %] 100 % (10/31 0435) Weight:  [56.8 kg-57 kg] 56.8 kg (10/31 0435)     Height: 5\' 3"  (160 cm) Weight: 56.8 kg BMI (Calculated): 22.19   INTAKE/OUTPUT:  This shift: No intake/output data recorded.  Last 2 shifts: @IOLAST2SHIFTS @   PHYSICAL EXAM:  Constitutional:  -- Normal body habitus  -- Awake, alert, and oriented x3  Eyes:  -- Pupils equally round and reactive to light  -- No scleral icterus  Ear, nose, and throat:  -- No jugular venous distension  Pulmonary:  -- No crackles  -- Equal breath sounds bilaterally -- Breathing non-labored at rest Cardiovascular:  -- S1, S2 present  -- No pericardial rubs Gastrointestinal:  -- Abdomen soft, nontender,  non-distended, no guarding or rebound tenderness -- No abdominal masses appreciated, pulsatile or otherwise  Musculoskeletal and Integumentary:  -- Wounds or skin discoloration: None appreciated -- Extremities: B/L UE and LE FROM, hands and feet warm, no edema  Neurologic:  -- Motor function: intact and symmetric -- Sensation: intact and symmetric   Labs:  CBC Latest Ref Rng & Units 03/10/2018 03/09/2018 03/04/2018  WBC 4.0 - 10.5 K/uL 19.5(H) 19.5(H) 8.3  Hemoglobin 12.0 - 15.0 g/dL 13.4 13.6 11.8(L)  Hematocrit 36.0 - 46.0 % 41.4 41.8 37.4  Platelets 150 - 400 K/uL 341 359 260   CMP Latest Ref Rng & Units 03/10/2018 03/09/2018 03/03/2018  Glucose 70 - 99 mg/dL 95 112(H) 104(H)  BUN 8 - 23 mg/dL 6(L) 8 9  Creatinine 0.44 -  1.00 mg/dL 0.69 0.63 0.63  Sodium 135 - 145 mmol/L 135 134(L) 134(L)  Potassium 3.5 - 5.1 mmol/L 4.0 3.5 3.6  Chloride 98 - 111 mmol/L 102 101 100  CO2 22 - 32 mmol/L 28 26 26   Calcium 8.9 - 10.3 mg/dL 8.6(L) 8.9 8.5(L)  Total Protein 6.5 - 8.1 g/dL - 6.9 6.7  Total Bilirubin 0.3 - 1.2 mg/dL - 0.4 0.6  Alkaline Phos 38 - 126 U/L - 66 73  AST 15 - 41 U/L - 19 29  ALT 0 - 44 U/L - 19 23   Imaging studies:  EXAM: CT ABDOMEN AND PELVIS WITH CONTRAST  TECHNIQUE: Multidetector CT imaging of the abdomen and pelvis was performed using the standard protocol following bolus administration of intravenous contrast.  CONTRAST:  129mL ISOVUE-300 IOPAMIDOL (ISOVUE-300) INJECTION 61%  COMPARISON:  03/03/2018  FINDINGS: Lower chest: Lung bases are within normal.  Mild cardiomegaly.  Hepatobiliary: Contracted gallbladder with cholelithiasis. Subcentimeter hypodensity over the right lobe of the liver unchanged likely a cyst. Biliary tree is within normal.  Pancreas: Normal.  Spleen: Normal.  Adrenals/Urinary Tract: Adrenal glands are normal. Kidneys are normal in size without hydronephrosis. No nephrolithiasis. 3.9 cm right renal cyst. Left kidney  has hilum oriented anteriorly. Ureters and bladder are normal.  Stomach/Bowel: Small sliding hiatal hernia. Stomach is otherwise unremarkable. Small bowel is within normal. Appendix is normal. Moderate diverticulosis of the colon. There is persistent acute diverticulitis involving the descending colon with slightly worse inflammatory change with associated wall thickening. No definite perforation or diverticular abscess. Mild adjacent free fluid.  Vascular/Lymphatic: Minimal calcified plaque involving the abdominal aorta. No adenopathy.  Reproductive: Normal.  Other: None.  Musculoskeletal: Degenerative change of the spine.  IMPRESSION: Evidence of patient's known acute diverticulitis of the descending colon with slightly worse adjacent inflammatory change and possibly slightly worse wall thickening. No evidence of perforation or diverticular abscess.  3.9 cm right renal cyst. Subcentimeter right liver hypodensity too small to characterize but likely a cyst.  Cholelithiasis.  Aortic Atherosclerosis (ICD10-I70.0).   Electronically Signed   By: Marin Olp M.D.   On: 03/10/2018 00:59  Assessment/Plan:  72 y.o. female with acute diverticulitis, that did not responded to oral antibiotic therapy.  Patient with worsening diverticulitis not responding to oral antibiotic. Patient with leukocytosis. Patient improved pain with one dose of antibiotics and pain medications. Will start with clear liquid diet. Since patient did not responded to Augmenting, will change antibiotc therapy to cipro and flagyl.   Patient oriented again about the rationale of not doing surgery at this moment and trying to treat and heal the colon with antibiotic to be able to do one more surgery with less chances of not needing a colostomy. Patient refers understood.   Will start with clear liquid, and assess for toleration. Will repeat labs in AM.   All of the above findings and  recommendations were discussed with the patient and her family, and all of patient's and her family's questions were answered to their expressed satisfaction.  Arnold Long, MD

## 2018-03-11 LAB — CBC WITH DIFFERENTIAL/PLATELET
Abs Immature Granulocytes: 0.05 10*3/uL (ref 0.00–0.07)
BASOS ABS: 0.1 10*3/uL (ref 0.0–0.1)
Basophils Relative: 1 %
EOS ABS: 0.2 10*3/uL (ref 0.0–0.5)
EOS PCT: 2 %
HCT: 39.3 % (ref 36.0–46.0)
Hemoglobin: 12.4 g/dL (ref 12.0–15.0)
Immature Granulocytes: 1 %
LYMPHS ABS: 1.7 10*3/uL (ref 0.7–4.0)
Lymphocytes Relative: 22 %
MCH: 28.6 pg (ref 26.0–34.0)
MCHC: 31.6 g/dL (ref 30.0–36.0)
MCV: 90.8 fL (ref 80.0–100.0)
Monocytes Absolute: 0.5 10*3/uL (ref 0.1–1.0)
Monocytes Relative: 7 %
NRBC: 0 % (ref 0.0–0.2)
Neutro Abs: 5.1 10*3/uL (ref 1.7–7.7)
Neutrophils Relative %: 67 %
Platelets: 313 10*3/uL (ref 150–400)
RBC: 4.33 MIL/uL (ref 3.87–5.11)
RDW: 13.2 % (ref 11.5–15.5)
WBC: 7.7 10*3/uL (ref 4.0–10.5)

## 2018-03-11 MED ORDER — CIPROFLOXACIN HCL 500 MG PO TABS: 500.0000 mg | ORAL_TABLET | Freq: Two times a day (BID) | ORAL | 0 refills | Status: AC

## 2018-03-11 MED ORDER — HYDROCODONE-ACETAMINOPHEN 5-325 MG PO TABS: 1.0000 | ORAL_TABLET | ORAL | 0 refills | Status: AC | PRN

## 2018-03-11 MED ORDER — METRONIDAZOLE 500 MG PO TABS: 500.0000 mg | ORAL_TABLET | Freq: Three times a day (TID) | ORAL | 0 refills | Status: AC

## 2018-03-11 NOTE — Discharge Summary (Signed)
  Patient ID: Sharon Pittman MRN: [id number] DOB/AGE: January 03, 1946 72 y.o.  Admit date: 03/09/2018 Discharge date: 03/11/2018   Discharge Diagnoses:  Active Problems:   Acute diverticulitis   Procedures: None  Hospital Course: Patient admitted with acute diverticulitis with pain and leukocytosis. New CT scan does not show perforation or abscess. With IV antibiotic pain improved and leukocytosis today is normal. Patient tolerating diet. Dietitian today gave orientation about recommended diet. Patient oriented during this admission about rationale of care with antibiotic to try to perform a single surgery and reduce the need of ostomy.   Physical Exam  Constitutional: She is well-developed, well-nourished, and in no distress.  HENT:  Head: Normocephalic.  Cardiovascular: Normal rate and regular rhythm.  Pulmonary/Chest: Effort normal. No respiratory distress.  Abdominal: Soft. Bowel sounds are normal. She exhibits no distension. There is no tenderness. There is no rebound.     Consults: Dietitian   Disposition: Discharge disposition: 01-Home or Self Care       Discharge Instructions    Increase activity slowly   Complete by:  As directed      Allergies as of 03/11/2018      Reactions   Ace Inhibitors Cough      Medication List    STOP taking these medications   amoxicillin-clavulanate 875-125 MG tablet Commonly known as:  AUGMENTIN     TAKE these medications   ALPRAZolam 0.25 MG tablet Commonly known as:  XANAX Take 0.25 mg by mouth at bedtime as needed for anxiety.   calcium carbonate 1500 (600 Ca) MG Tabs tablet Commonly known as:  OSCAL Take 1 tablet by mouth daily with breakfast.   chlorthalidone 25 MG tablet Commonly known as:  HYGROTON Take 25 mg by mouth 2 (two) times a week.   ciprofloxacin 500 MG tablet Commonly known as:  CIPRO Take 1 tablet (500 mg total) by mouth 2 (two) times daily for 14 days.   HYDROcodone-acetaminophen 5-325 MG  tablet Commonly known as:  NORCO/VICODIN Take 1 tablet by mouth every 4 (four) hours as needed for up to 3 days for moderate pain.   metroNIDAZOLE 500 MG tablet Commonly known as:  FLAGYL Take 1 tablet (500 mg total) by mouth 3 (three) times daily for 14 days.   mometasone 50 MCG/ACT nasal spray Commonly known as:  NASONEX Place 2 sprays into the nose daily.   pantoprazole 20 MG tablet Commonly known as:  PROTONIX Take 1 tablet (20 mg total) by mouth daily for 20 days.      Follow-up Information    Herbert Pun, MD Follow up in 1 week(s).   Specialty:  General Surgery Why:  03/18/18 10:00AM Contact information: [address] [phone]

## 2018-03-11 NOTE — Discharge Instructions (Signed)
°  Diet: Resume diet as oriented by dietitian.   Activity: Light activity encouraged. Do not drink alcohol if taking antibiotic and pain medications.  Medications: Resume all home medications. For mild to moderate pain: acetaminophen (Tylenol) or ibuprofen (if no kidney disease). Combining Tylenol with alcohol can substantially increase your risk of causing liver disease. Narcotic pain medications, if prescribed, can be used for severe pain, though may cause nausea, constipation, and drowsiness. Do not combine Tylenol and Norco within a 6 hour period as Norco contains Tylenol. If you do not need the narcotic pain medication, you do not need to fill the prescription.  Call office [phone]) at any time if any questions, worsening pain, fevers/chills, bleeding, drainage from incision site, or other concerns.

## 2018-03-11 NOTE — Plan of Care (Signed)
Nutrition Education Note  RD consulted for nutrition education for diverticulitis.  RD provided "Nutrition and Diverticulitis" handout with supporting information. Reviewed patient's dietary recall. Provided examples of low and high fiber foods. Discouraged intake of high fiber foods, high fat foods, spicy foods, processed foods, caffeine and red meats when having a flare. Encouraged pt to cook foods until they are soft and chew foods well to help aid in digestion. Also recommend frequent small meals. Encouraged use of a multi-vitamin and protein supplements while having a flare.    RD encouraged intake of high fiber foods when not having a flare.   Expect good compliance.  Body mass index is 22.18 kg/m. Pt meets criteria for normal weight based on current BMI.  Current diet order is full liquids, patient is consuming approximately 75% of meals at this time. Labs and medications reviewed. No further nutrition interventions warranted at this time. RD contact information provided. If additional nutrition issues arise, please re-consult RD.  Koleen Distance MS, RD, LDN Pager #- [phone] Office#- [phone] After Hours Pager: [phone]

## 2018-03-22 ENCOUNTER — Ambulatory Visit: Payer: Medicare Other

## 2018-03-29 ENCOUNTER — Ambulatory Visit
Admission: RE | Admit: 2018-03-29 | Discharge: 2018-03-29 | Disposition: A | Discharge status: Home / Self Care | Payer: Medicare Other | Source: Ambulatory Visit | Attending: Internal Medicine | Admitting: Internal Medicine

## 2018-03-29 DIAGNOSIS — R140 Abdominal distension (gaseous): Secondary | ICD-10-CM | POA: Diagnosis not present

## 2018-03-29 DIAGNOSIS — K5730 Diverticulosis of large intestine without perforation or abscess without bleeding: Secondary | ICD-10-CM | POA: Diagnosis not present

## 2018-03-29 DIAGNOSIS — K5792 Diverticulitis of intestine, part unspecified, without perforation or abscess without bleeding: Secondary | ICD-10-CM

## 2018-03-29 DIAGNOSIS — K631 Perforation of intestine (nontraumatic): Secondary | ICD-10-CM

## 2018-03-29 MED ORDER — IOPAMIDOL (ISOVUE-300) INJECTION 61%
100.0000 mL | Freq: Once | INTRAVENOUS | Status: AC | PRN
  Administered 2018-03-29: 100 mL via INTRAVENOUS

## 2018-07-25 ENCOUNTER — Other Ambulatory Visit: Payer: Self-pay | Admitting: Internal Medicine

## 2018-07-25 DIAGNOSIS — Z1231 Encounter for screening mammogram for malignant neoplasm of breast: Secondary | ICD-10-CM

## 2018-12-08 ENCOUNTER — Other Ambulatory Visit: Payer: Self-pay

## 2018-12-08 ENCOUNTER — Ambulatory Visit
Admission: RE | Admit: 2018-12-08 | Discharge: 2018-12-08 | Disposition: A | Discharge status: Home / Self Care | Payer: Medicare Other | Source: Ambulatory Visit | Attending: Internal Medicine | Admitting: Internal Medicine

## 2018-12-08 DIAGNOSIS — Z1231 Encounter for screening mammogram for malignant neoplasm of breast: Secondary | ICD-10-CM | POA: Insufficient documentation

## 2019-11-06 ENCOUNTER — Other Ambulatory Visit: Payer: Self-pay | Admitting: Internal Medicine

## 2019-11-06 DIAGNOSIS — Z1231 Encounter for screening mammogram for malignant neoplasm of breast: Secondary | ICD-10-CM

## 2019-12-11 ENCOUNTER — Ambulatory Visit
Admission: RE | Admit: 2019-12-11 | Discharge: 2019-12-11 | Disposition: A | Discharge status: Home / Self Care | Payer: Medicare Other | Source: Ambulatory Visit | Attending: Internal Medicine | Admitting: Internal Medicine

## 2019-12-11 ENCOUNTER — Other Ambulatory Visit: Payer: Self-pay

## 2019-12-11 DIAGNOSIS — Z1231 Encounter for screening mammogram for malignant neoplasm of breast: Secondary | ICD-10-CM | POA: Insufficient documentation

## 2020-04-14 IMAGING — CT CT ABD-PELV W/ CM
2 of 5 series · 15 of 46 positions shown, 17 images · IV contrast (APPLIED)
Comparison: None.

CLINICAL DATA: 72 y/o  F; elevated white blood cell count.

EXAM:
CT ABDOMEN AND PELVIS WITH CONTRAST
TECHNIQUE: Multidetector CT imaging of the abdomen and pelvis was performed
using the standard protocol following bolus administration of
intravenous contrast.
CONTRAST:  100mL SBJMVR-FEE IOPAMIDOL (SBJMVR-FEE) INJECTION 61%

[Series 2: axial st · axial · 0.73mm/px · z∈[-748,-353]mm · 12 of 89 slices shown, 14 images]
[im 5/89  soft-tissue]
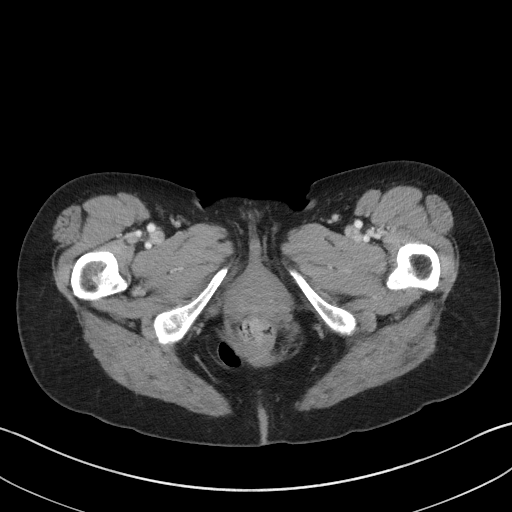
[im 5/89  bone]
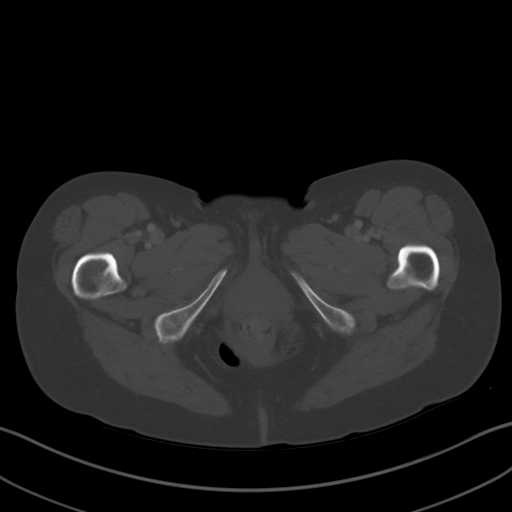
[im 15/89  soft-tissue]
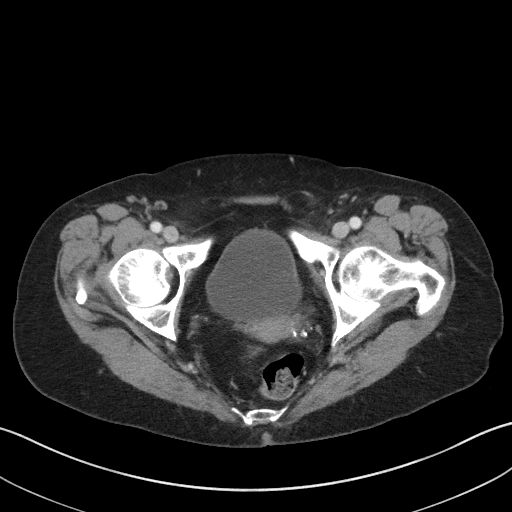
[im 20/89  soft-tissue]
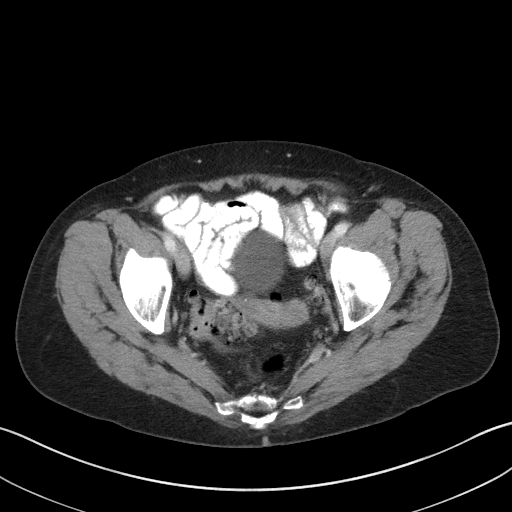
[im 25/89  soft-tissue]
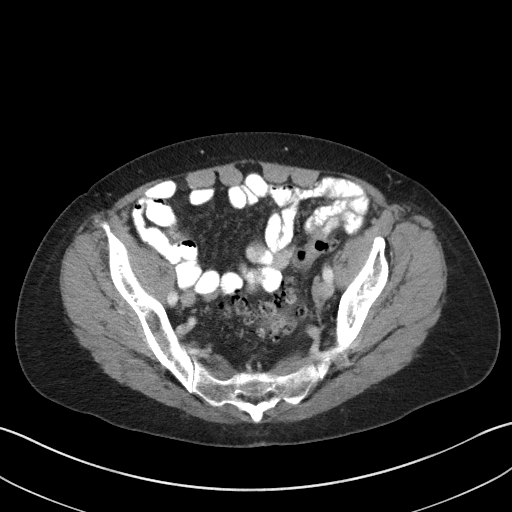
[im 35/89  soft-tissue]
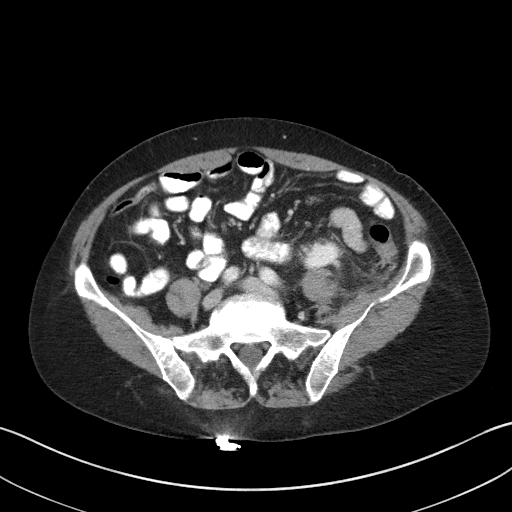
[im 40/89  soft-tissue]
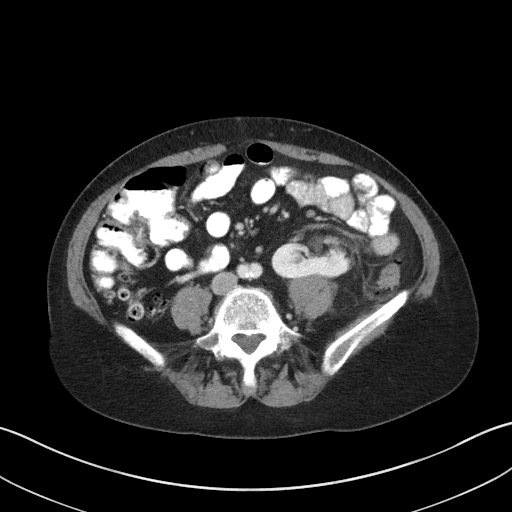
[im 49/89  soft-tissue]
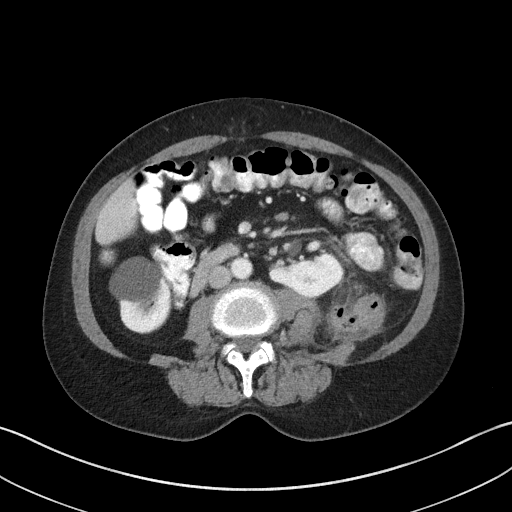
[im 54/89  soft-tissue]
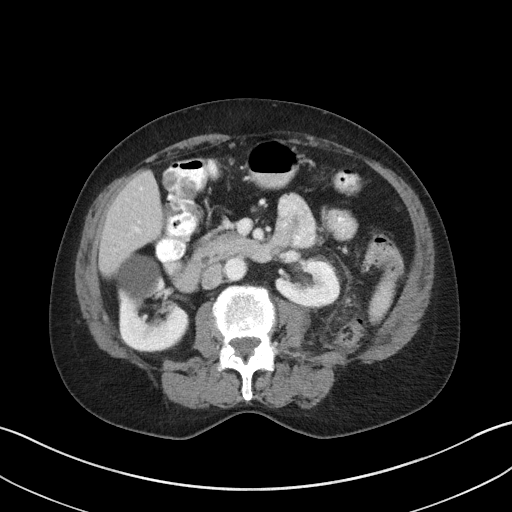
[im 64/89  soft-tissue]
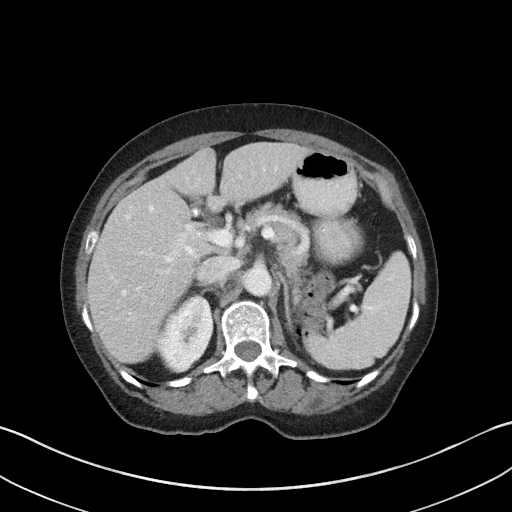
[im 64/89  bone]
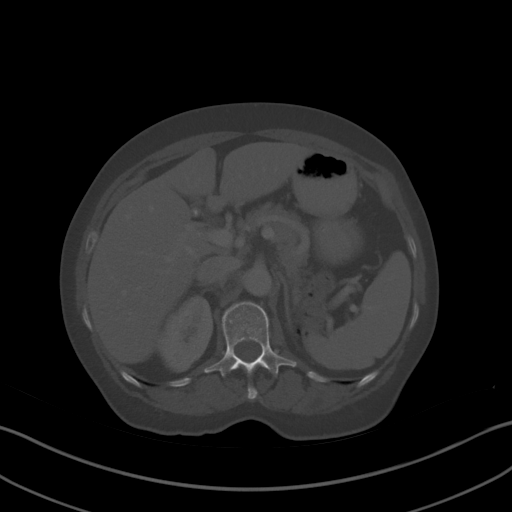
[im 69/89  soft-tissue]
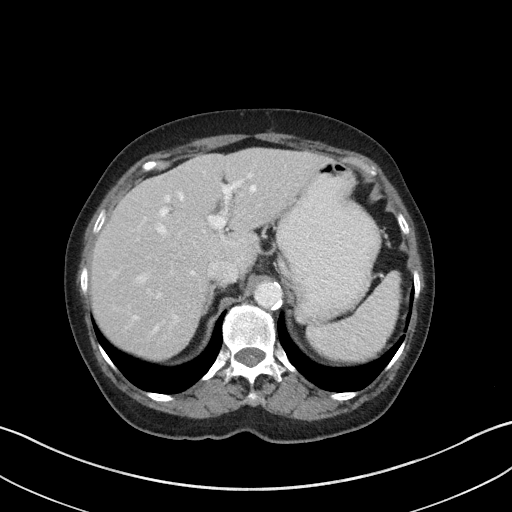
[im 74/89  soft-tissue]
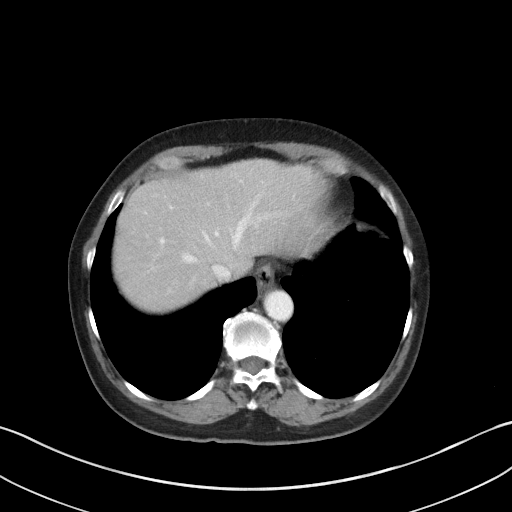
[im 84/89  soft-tissue]
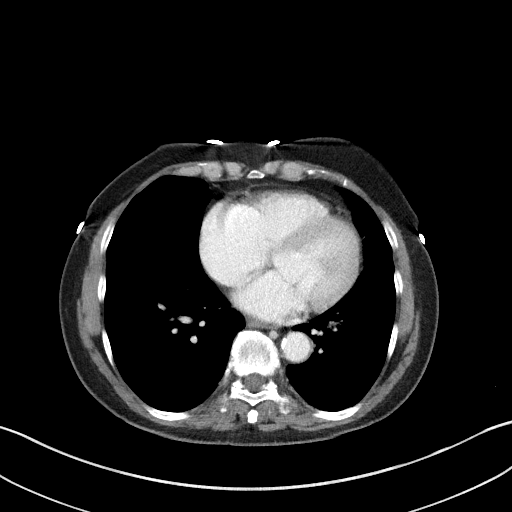

[Series 5: coronal st · coronal · 0.68mm/px · 3 of 84 slices shown]
[im 28/84  soft-tissue]
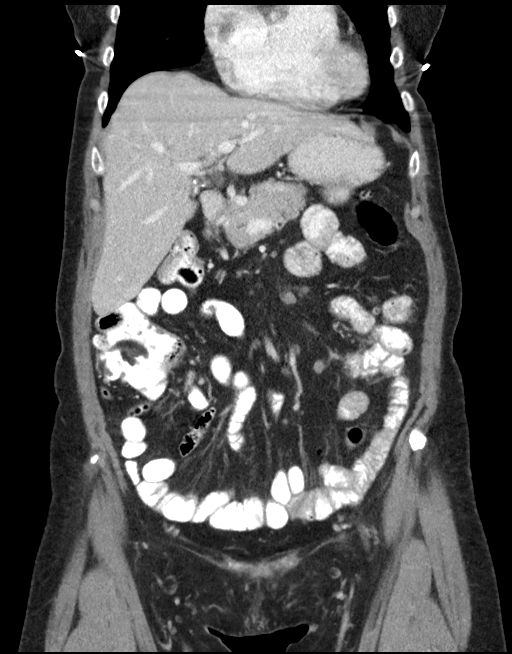
[im 37/84  soft-tissue]
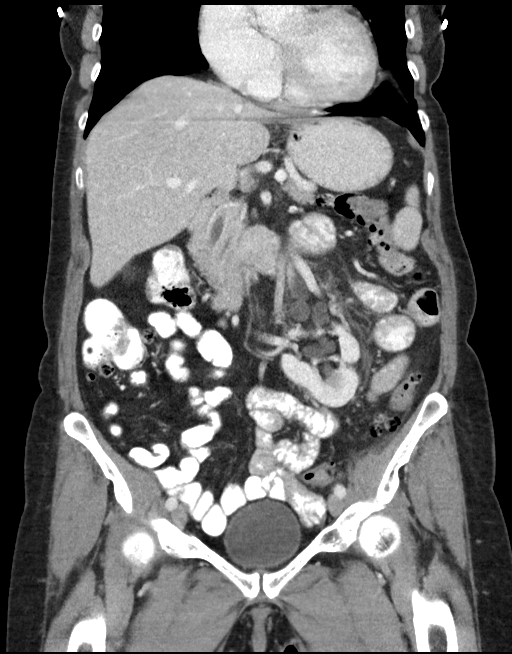
[im 47/84  soft-tissue]
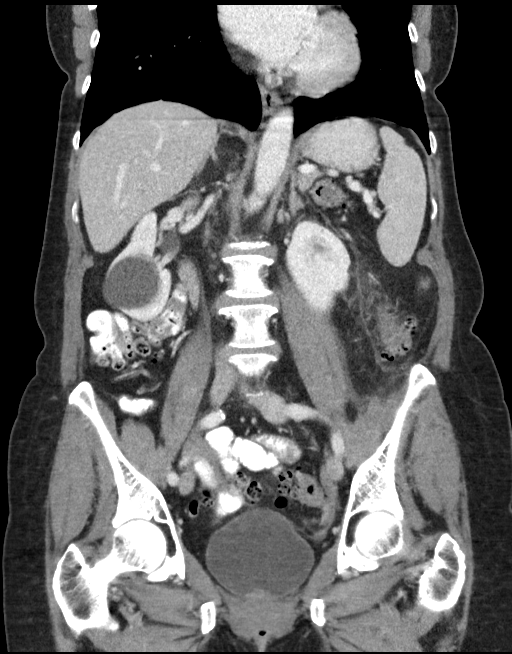

[15 of 46 positions shown; findings below may reference images not displayed]

FINDINGS: Lower chest: No acute abnormality.

Hepatobiliary: Liver segment 4A and segment 5 subcentimeter
lucencies, likely cyst. Appendectomy. No biliary ductal dilatation.

Pancreas: Unremarkable. No pancreatic ductal dilatation or
surrounding inflammatory changes.

Spleen: Normal in size without focal abnormality.

Adrenals/Urinary Tract: Adrenal glands are unremarkable. Right
kidney interpolar cyst measuring 4.2 cm. Left kidney ectopia,
inferiorly located, and malrotation. Kidneys are otherwise normal,
without renal calculi, focal lesion, or hydronephrosis. Bladder is
unremarkable.

Stomach/Bowel: No obstructive or inflammatory changes of the
stomach, small bowel, or appendix. Severe extensive pan colonic
diverticulosis.

Acute perforated diverticulitis within the retroperitoneal segment
of descending colon. 14 mm air and fluid-filled collection adjacent
to the colon (series 2, image 41). Surrounding left-sided
retroperitoneal edema.

Vascular/Lymphatic: Aortic atherosclerosis. No enlarged abdominal or
pelvic lymph nodes.

Reproductive: Uterus and bilateral adnexa are unremarkable.

Other: Small upper abdominal ventral midline hernia containing fat.

Musculoskeletal: No fracture is seen. Multilevel discogenic
degenerative changes of the lumbar spine greatest at the L5-S1 level
with there is moderate loss of intervertebral disc space height.
IMPRESSION: 1. Acute perforated diverticulitis of the retroperitoneal segment of
descending colon with 14 mm air and fluid-filled collection adjacent
to the colon. Surrounding left-sided retroperitoneal edema.
2. Pancolonic diverticulosis.
3.  Aortic Atherosclerosis ([id number]).
4. Small upper abdominal ventral midline hernia containing fat.

These results will be called to the ordering clinician or
representative by the Radiologist Assistant, and communication
documented in the PACS or zVision Dashboard.

By: Fartun Damian M.D.

## 2020-11-05 ENCOUNTER — Other Ambulatory Visit: Payer: Self-pay | Admitting: Internal Medicine

## 2020-11-05 DIAGNOSIS — Z1231 Encounter for screening mammogram for malignant neoplasm of breast: Secondary | ICD-10-CM

## 2020-12-12 ENCOUNTER — Ambulatory Visit
Admission: RE | Admit: 2020-12-12 | Discharge: 2020-12-12 | Disposition: A | Discharge status: Home / Self Care | Payer: Medicare Other | Source: Ambulatory Visit | Attending: Internal Medicine | Admitting: Internal Medicine

## 2020-12-12 ENCOUNTER — Other Ambulatory Visit: Payer: Self-pay

## 2020-12-12 DIAGNOSIS — Z1231 Encounter for screening mammogram for malignant neoplasm of breast: Secondary | ICD-10-CM | POA: Diagnosis not present

## 2021-10-01 ENCOUNTER — Emergency Department: Payer: Medicare Other | Admitting: Anesthesiology

## 2021-10-01 ENCOUNTER — Other Ambulatory Visit: Payer: Self-pay

## 2021-10-01 ENCOUNTER — Emergency Department: Payer: Medicare Other

## 2021-10-01 ENCOUNTER — Encounter
Admission: EM | Disposition: A | Discharge status: Home / Self Care | Payer: Self-pay | Source: Home / Self Care | Attending: Surgery

## 2021-10-01 ENCOUNTER — Inpatient Hospital Stay
Admission: EM | Admit: 2021-10-01 | Discharge: 2021-10-06 | DRG: 329 | Disposition: A | Discharge status: Home / Self Care | Payer: Medicare Other | Attending: Surgery | Admitting: Surgery

## 2021-10-01 DIAGNOSIS — K430 Incisional hernia with obstruction, without gangrene: Principal | ICD-10-CM | POA: Diagnosis present

## 2021-10-01 DIAGNOSIS — F419 Anxiety disorder, unspecified: Secondary | ICD-10-CM | POA: Diagnosis present

## 2021-10-01 DIAGNOSIS — K562 Volvulus: Secondary | ICD-10-CM | POA: Diagnosis present

## 2021-10-01 DIAGNOSIS — R109 Unspecified abdominal pain: Secondary | ICD-10-CM | POA: Diagnosis present

## 2021-10-01 DIAGNOSIS — Z79899 Other long term (current) drug therapy: Secondary | ICD-10-CM | POA: Diagnosis not present

## 2021-10-01 DIAGNOSIS — K5792 Diverticulitis of intestine, part unspecified, without perforation or abscess without bleeding: Secondary | ICD-10-CM | POA: Diagnosis present

## 2021-10-01 DIAGNOSIS — K436 Other and unspecified ventral hernia with obstruction, without gangrene: Secondary | ICD-10-CM

## 2021-10-01 DIAGNOSIS — R112 Nausea with vomiting, unspecified: Secondary | ICD-10-CM

## 2021-10-01 DIAGNOSIS — Z888 Allergy status to other drugs, medicaments and biological substances status: Secondary | ICD-10-CM | POA: Diagnosis not present

## 2021-10-01 DIAGNOSIS — Z90711 Acquired absence of uterus with remaining cervical stump: Secondary | ICD-10-CM | POA: Diagnosis not present

## 2021-10-01 DIAGNOSIS — R1031 Right lower quadrant pain: Principal | ICD-10-CM

## 2021-10-01 DIAGNOSIS — K56609 Unspecified intestinal obstruction, unspecified as to partial versus complete obstruction: Secondary | ICD-10-CM | POA: Diagnosis not present

## 2021-10-01 DIAGNOSIS — Z803 Family history of malignant neoplasm of breast: Secondary | ICD-10-CM

## 2021-10-01 DIAGNOSIS — Z9049 Acquired absence of other specified parts of digestive tract: Secondary | ICD-10-CM

## 2021-10-01 DIAGNOSIS — K660 Peritoneal adhesions (postprocedural) (postinfection): Secondary | ICD-10-CM | POA: Diagnosis present

## 2021-10-01 HISTORY — PX: INCISIONAL HERNIA REPAIR: SHX193

## 2021-10-01 HISTORY — PX: XI ROBOT ASSISTED DIAGNOSTIC LAPAROSCOPY: SHX6815

## 2021-10-01 LAB — CBC
HCT: 44.1 % (ref 36.0–46.0)
Hemoglobin: 14.8 g/dL (ref 12.0–15.0)
MCH: 28.7 pg (ref 26.0–34.0)
MCHC: 33.6 g/dL (ref 30.0–36.0)
MCV: 85.6 fL (ref 80.0–100.0)
Platelets: 379 10*3/uL (ref 150–400)
RBC: 5.15 MIL/uL — ABNORMAL HIGH (ref 3.87–5.11)
RDW: 12.9 % (ref 11.5–15.5)
WBC: 9.7 10*3/uL (ref 4.0–10.5)
nRBC: 0 % (ref 0.0–0.2)

## 2021-10-01 LAB — COMPREHENSIVE METABOLIC PANEL
ALT: 25 U/L (ref 0–44)
AST: 22 U/L (ref 15–41)
Albumin: 4 g/dL (ref 3.5–5.0)
Alkaline Phosphatase: 59 U/L (ref 38–126)
Anion gap: 10 (ref 5–15)
BUN: 11 mg/dL (ref 8–23)
CO2: 26 mmol/L (ref 22–32)
Calcium: 9.3 mg/dL (ref 8.9–10.3)
Chloride: 92 mmol/L — ABNORMAL LOW (ref 98–111)
Creatinine, Ser: 0.66 mg/dL (ref 0.44–1.00)
GFR, Estimated: >60 mL/min (ref >60)
Glucose, Bld: 115 mg/dL — ABNORMAL HIGH (ref 70–99)
Potassium: 3.6 mmol/L (ref 3.5–5.1)
Sodium: 128 mmol/L — ABNORMAL LOW (ref 135–145)
Total Bilirubin: 0.6 mg/dL (ref 0.3–1.2)
Total Protein: 7.3 g/dL (ref 6.5–8.1)

## 2021-10-01 LAB — URINALYSIS, ROUTINE W REFLEX MICROSCOPIC
Bacteria, UA: NONE SEEN
Bilirubin Urine: NEGATIVE
Glucose, UA: 50 mg/dL — AB
Ketones, ur: 20 mg/dL — AB
Nitrite: NEGATIVE
Protein, ur: NEGATIVE mg/dL
Specific Gravity, Urine: 1.028 (ref 1.005–1.030)
pH: 8 (ref 5.0–8.0)

## 2021-10-01 LAB — LIPASE, BLOOD: Lipase: 35 U/L (ref 11–51)

## 2021-10-01 SURGERY — REPAIR, HERNIA, INCISIONAL
Anesthesia: General | Site: Abdomen

## 2021-10-01 MED ORDER — BUPIVACAINE-EPINEPHRINE (PF) 0.25% -1:200000 IJ SOLN
INTRAMUSCULAR | Status: AC
  Filled 2021-10-01: qty 30

## 2021-10-01 MED ORDER — LACTATED RINGERS IV SOLN: INTRAVENOUS | Status: DC | PRN

## 2021-10-01 MED ORDER — HYDROMORPHONE HCL 1 MG/ML IJ SOLN
0.2500 mg | INTRAMUSCULAR | Status: DC | PRN
  Administered 2021-10-01 – 2021-10-02 (×4): 0.5 mg via INTRAVENOUS

## 2021-10-01 MED ORDER — FENTANYL CITRATE (PF) 100 MCG/2ML IJ SOLN
INTRAMUSCULAR | Status: DC | PRN
  Administered 2021-10-01 (×4): 50 ug via INTRAVENOUS

## 2021-10-01 MED ORDER — ACETAMINOPHEN 10 MG/ML IV SOLN
INTRAVENOUS | Status: AC
  Filled 2021-10-01: qty 100

## 2021-10-01 MED ORDER — FENTANYL CITRATE PF 50 MCG/ML IJ SOSY
50.0000 ug | PREFILLED_SYRINGE | Freq: Once | INTRAMUSCULAR | Status: AC
  Administered 2021-10-01: 50 ug via INTRAVENOUS
  Filled 2021-10-01: qty 1

## 2021-10-01 MED ORDER — DROPERIDOL 2.5 MG/ML IJ SOLN: 0.6250 mg | Freq: Once | INTRAMUSCULAR | Status: DC | PRN

## 2021-10-01 MED ORDER — ACETAMINOPHEN 10 MG/ML IV SOLN
1000.0000 mg | Freq: Once | INTRAVENOUS | Status: DC | PRN
  Administered 2021-10-01: 1000 mg via INTRAVENOUS

## 2021-10-01 MED ORDER — CEFAZOLIN SODIUM-DEXTROSE 2-3 GM-%(50ML) IV SOLR
INTRAVENOUS | Status: DC | PRN
  Administered 2021-10-01: 2 g via INTRAVENOUS

## 2021-10-01 MED ORDER — SUGAMMADEX SODIUM 200 MG/2ML IV SOLN
INTRAVENOUS | Status: DC | PRN
  Administered 2021-10-01: 250 mg via INTRAVENOUS

## 2021-10-01 MED ORDER — PHENYLEPHRINE HCL (PRESSORS) 10 MG/ML IV SOLN
INTRAVENOUS | Status: DC | PRN
  Administered 2021-10-01 (×3): 80 ug via INTRAVENOUS

## 2021-10-01 MED ORDER — ONDANSETRON HCL 4 MG/2ML IJ SOLN
4.0000 mg | Freq: Once | INTRAMUSCULAR | Status: AC
  Administered 2021-10-01: 4 mg via INTRAVENOUS
  Filled 2021-10-01: qty 2

## 2021-10-01 MED ORDER — IOHEXOL 300 MG/ML  SOLN
100.0000 mL | Freq: Once | INTRAMUSCULAR | Status: AC | PRN
  Administered 2021-10-01: 100 mL via INTRAVENOUS

## 2021-10-01 MED ORDER — ROCURONIUM BROMIDE 100 MG/10ML IV SOLN
INTRAVENOUS | Status: DC | PRN
  Administered 2021-10-01: 60 mg via INTRAVENOUS
  Administered 2021-10-01: 20 mg via INTRAVENOUS

## 2021-10-01 MED ORDER — BUPIVACAINE LIPOSOME 1.3 % IJ SUSP
INTRAMUSCULAR | Status: AC
  Filled 2021-10-01: qty 20

## 2021-10-01 MED ORDER — SODIUM CHLORIDE 0.9 % IV BOLUS
1000.0000 mL | Freq: Once | INTRAVENOUS | Status: AC
  Administered 2021-10-01: 1000 mL via INTRAVENOUS

## 2021-10-01 MED ORDER — FENTANYL CITRATE (PF) 100 MCG/2ML IJ SOLN
INTRAMUSCULAR | Status: AC
Start: 2021-10-01 — End: ?
  Filled 2021-10-01: qty 2

## 2021-10-01 MED ORDER — 0.9 % SODIUM CHLORIDE (POUR BTL) OPTIME
TOPICAL | Status: DC | PRN
  Administered 2021-10-01: 5 mL

## 2021-10-01 MED ORDER — PROMETHAZINE HCL 25 MG/ML IJ SOLN: 6.2500 mg | INTRAMUSCULAR | Status: DC | PRN

## 2021-10-01 MED ORDER — MORPHINE SULFATE (PF) 4 MG/ML IV SOLN: 4.0000 mg | INTRAVENOUS | Status: DC | PRN

## 2021-10-01 MED ORDER — STERILE WATER FOR INJECTION IJ SOLN
INTRAMUSCULAR | Status: DC | PRN
  Administered 2021-10-01 (×2): 2 mL via INTRAVENOUS

## 2021-10-01 MED ORDER — MORPHINE SULFATE (PF) 4 MG/ML IV SOLN
4.0000 mg | Freq: Once | INTRAVENOUS | Status: AC
  Administered 2021-10-01: 4 mg via INTRAVENOUS
  Filled 2021-10-01: qty 1

## 2021-10-01 MED ORDER — HYDROMORPHONE HCL 1 MG/ML IJ SOLN
INTRAMUSCULAR | Status: AC
  Filled 2021-10-01: qty 1

## 2021-10-01 MED ORDER — BUPIVACAINE LIPOSOME 1.3 % IJ SUSP
INTRAMUSCULAR | Status: DC | PRN
  Administered 2021-10-01: 50 mL via INTRAMUSCULAR

## 2021-10-01 MED ORDER — LIDOCAINE HCL (CARDIAC) PF 100 MG/5ML IV SOSY
PREFILLED_SYRINGE | INTRAVENOUS | Status: DC | PRN
Start: 2021-10-01 — End: 2021-10-01
  Administered 2021-10-01: 100 mg via INTRAVENOUS

## 2021-10-01 MED ORDER — FENTANYL CITRATE (PF) 100 MCG/2ML IJ SOLN
INTRAMUSCULAR | Status: AC
  Filled 2021-10-01: qty 2

## 2021-10-01 MED ORDER — SODIUM CHLORIDE 0.9 % IR SOLN
Status: DC | PRN
  Administered 2021-10-01: 3000 mL

## 2021-10-01 MED ORDER — PHENYLEPHRINE HCL-NACL 20-0.9 MG/250ML-% IV SOLN
INTRAVENOUS | Status: DC | PRN
  Administered 2021-10-01: 40 ug/min via INTRAVENOUS

## 2021-10-01 MED ORDER — FENTANYL CITRATE PF 50 MCG/ML IJ SOSY
50.0000 ug | PREFILLED_SYRINGE | INTRAMUSCULAR | Status: AC | PRN
  Administered 2021-10-01 (×2): 50 ug via INTRAVENOUS
  Filled 2021-10-01 (×2): qty 1

## 2021-10-01 SURGICAL SUPPLY — 63 items
BAG RETRIEVAL 10 (BASKET) ×1
BINDER ABDOMINAL  9 SM 30-45 (SOFTGOODS) ×1
BINDER ABDOMINAL 9 SM 30-45 (SOFTGOODS) IMPLANT
CANNULA REDUC XI 12-8 STAPL (CANNULA) ×1
CANNULA REDUCER 12-8 DVNC XI (CANNULA) IMPLANT
COVER TIP SHEARS 8 DVNC (MISCELLANEOUS) ×3 IMPLANT
COVER TIP SHEARS 8MM DA VINCI (MISCELLANEOUS) ×1
DERMABOND ADVANCED (GAUZE/BANDAGES/DRESSINGS) ×1
DERMABOND ADVANCED .7 DNX12 (GAUZE/BANDAGES/DRESSINGS) ×3 IMPLANT
DRAPE ARM DVNC X/XI (DISPOSABLE) ×12 IMPLANT
DRAPE COLUMN DVNC XI (DISPOSABLE) ×3 IMPLANT
DRAPE DA VINCI XI ARM (DISPOSABLE) ×4
DRAPE DA VINCI XI COLUMN (DISPOSABLE) ×1
ELECT REM PT RETURN 9FT ADLT (ELECTROSURGICAL) ×4
ELECTRODE REM PT RTRN 9FT ADLT (ELECTROSURGICAL) ×3 IMPLANT
GLOVE ORTHO TXT STRL SZ7.5 (GLOVE) ×12 IMPLANT
GOWN STRL REUS W/ TWL LRG LVL3 (GOWN DISPOSABLE) ×6 IMPLANT
GOWN STRL REUS W/ TWL XL LVL3 (GOWN DISPOSABLE) ×6 IMPLANT
GOWN STRL REUS W/TWL LRG LVL3 (GOWN DISPOSABLE) ×2
GOWN STRL REUS W/TWL XL LVL3 (GOWN DISPOSABLE) ×2
GRASPER SUT TROCAR 14GX15 (MISCELLANEOUS) ×1 IMPLANT
IRRIGATION STRYKERFLOW (MISCELLANEOUS) IMPLANT
IRRIGATOR STRYKERFLOW (MISCELLANEOUS) ×4
IV NS 1000ML (IV SOLUTION) ×3
IV NS 1000ML BAXH (IV SOLUTION) IMPLANT
KIT PINK PAD W/HEAD ARE REST (MISCELLANEOUS) ×4 IMPLANT
KIT PINK PAD W/HEAD ARM REST (MISCELLANEOUS) ×3 IMPLANT
KIT TURNOVER KIT A (KITS) ×4 IMPLANT
MANIFOLD NEPTUNE II (INSTRUMENTS) ×4 IMPLANT
NDL INSUFFLATION 14GA 120MM (NEEDLE) IMPLANT
NEEDLE HYPO 22GX1.5 SAFETY (NEEDLE) ×4 IMPLANT
NEEDLE INSUFFLATION 14GA 120MM (NEEDLE) ×4 IMPLANT
NS IRRIG 500ML POUR BTL (IV SOLUTION) ×4 IMPLANT
PACK LAP CHOLECYSTECTOMY (MISCELLANEOUS) ×4 IMPLANT
RELOAD STAPLE 45 3.5 BLU DVNC (STAPLE) IMPLANT
RELOAD STAPLE 45 4.3 GRN DVNC (STAPLE) IMPLANT
RELOAD STAPLER 3.5X45 BLU DVNC (STAPLE) ×6 IMPLANT
RELOAD STAPLER 4.3X45 GRN DVNC (STAPLE) ×3 IMPLANT
SEAL CANN UNIV 5-8 DVNC XI (MISCELLANEOUS) ×9 IMPLANT
SEAL XI 5MM-8MM UNIVERSAL (MISCELLANEOUS) ×3
SEALER VESSEL DA VINCI XI (MISCELLANEOUS) ×1
SEALER VESSEL EXT DVNC XI (MISCELLANEOUS) IMPLANT
SPIKE FLUID TRANSFER (MISCELLANEOUS) ×4 IMPLANT
STAPLER 45 DA VINCI SURE FORM (STAPLE) ×1
STAPLER 45 SUREFORM DVNC (STAPLE) IMPLANT
STAPLER CANNULA SEAL DVNC XI (STAPLE) IMPLANT
STAPLER CANNULA SEAL XI (STAPLE) ×1
STAPLER RELOAD 3.5X45 BLU DVNC (STAPLE) ×6
STAPLER RELOAD 3.5X45 BLUE (STAPLE) ×2
STAPLER RELOAD 4.3X45 GREEN (STAPLE) ×1
STAPLER RELOAD 4.3X45 GRN DVNC (STAPLE) ×3
SUT MNCRL 4-0 (SUTURE) ×1
SUT MNCRL 4-0 27XMFL (SUTURE) ×3
SUT STRATAFIX 0 PDS+ CT-2 23 (SUTURE) ×8
SUT VICRYL 0 AB UR-6 (SUTURE) ×4 IMPLANT
SUTURE MNCRL 4-0 27XMF (SUTURE) ×3 IMPLANT
SUTURE STRATFX 0 PDS+ CT-2 23 (SUTURE) IMPLANT
SYS BAG RETRIEVAL 10MM (BASKET) ×3
SYSTEM BAG RETRIEVAL 10MM (BASKET) ×3 IMPLANT
TROCAR XCEL 12X100 BLDLESS (ENDOMECHANICALS) ×4 IMPLANT
TROCAR Z-THREAD FIOS 11X100 BL (TROCAR) ×4 IMPLANT
TUBING INSUFFLATION (TUBING) ×4 IMPLANT
WATER STERILE IRR 500ML POUR (IV SOLUTION) ×4 IMPLANT

## 2021-10-01 NOTE — ED Triage Notes (Signed)
Pt states she had a hysterectomy and bladder tacked up on 5/15 and today having sudden onset lower abd pain with nausea.

## 2021-10-01 NOTE — Anesthesia Procedure Notes (Signed)
Procedure Name: Intubation Date/Time: 10/01/2021 8:36 PM Performed by: Lendon Colonel, CRNA Pre-anesthesia Checklist: Patient identified, Patient being monitored, Timeout performed, Emergency Drugs available and Suction available Patient Re-evaluated:Patient Re-evaluated prior to induction Oxygen Delivery Method: Circle system utilized Preoxygenation: Pre-oxygenation with 100% oxygen Induction Type: IV induction Ventilation: Mask ventilation without difficulty Laryngoscope Size: Mac and 3 Grade View: Grade I Tube type: Oral Tube size: 7.0 mm Number of attempts: 1 Airway Equipment and Method: Stylet Placement Confirmation: ETT inserted through vocal cords under direct vision, positive ETCO2 and breath sounds checked- equal and bilateral Secured at: 21 cm Tube secured with: Tape Dental Injury: Teeth and Oropharynx as per pre-operative assessment

## 2021-10-01 NOTE — H&P (Addendum)
Patient ID: Sharon Pittman, female   DOB: 11/26/1945, 76 y.o.   MRN: [id number]  Chief Complaint: Postoperative lower abdominal pain with vomiting.  History of Present Illness Sharon Pittman is a 76 y.o. female with history of recent laparoscopic assisted GYN procedures 9 days ago.  Who this morning developed progressive nausea and right lower quadrant abdominal pain.  She presented to the ED where she began vomiting bilious emesis.  With increasing pain.  Work-up included a CT scan which showed small bowel obstruction secondary to an incarcerated incisional hernia in the right lower quadrant. Attempts to get her back to her surgeon were thwarted. She reports nausea, vomiting.  Denies fevers and chills.  Past Medical History Past Medical History:  Diagnosis Date   Anxiety    Elevated lipids       Past Surgical History:  Procedure Laterality Date   BLADDER SUSPENSION     COLONOSCOPY     COLONOSCOPY WITH PROPOFOL N/A 11/06/2015   Procedure: COLONOSCOPY WITH PROPOFOL;  Surgeon: Manya Silvas, MD;  Location: [facility];  Service: Endoscopy;  Laterality: N/A;   LAPAROSCOPIC HYSTERECTOMY     uretheral polyp     excision/ fulguration     Allergies  Allergen Reactions   Ace Inhibitors Cough    No current facility-administered medications for this encounter.   Current Outpatient Medications  Medication Sig Dispense Refill   ALPRAZolam (XANAX) 0.25 MG tablet Take 0.25 mg by mouth at bedtime as needed for anxiety.     calcium carbonate (OSCAL) 1500 (600 Ca) MG TABS tablet Take 1 tablet by mouth daily with breakfast.     chlorthalidone (HYGROTON) 25 MG tablet Take 25 mg by mouth 2 (two) times a week.      mometasone (NASONEX) 50 MCG/ACT nasal spray Place 2 sprays into the nose daily.      pantoprazole (PROTONIX) 20 MG tablet Take 1 tablet (20 mg total) by mouth daily for 20 days. 20 tablet 0    Family History Family History  Problem Relation Age of Onset   Breast cancer  Maternal Grandmother 88      Social History Social History   Tobacco Use   Smoking status: Never   Smokeless tobacco: Never  Substance Use Topics   Alcohol use: Not Currently   Drug use: Not Currently        Review of Systems  All other systems reviewed and are negative.    Physical Exam Blood pressure (!) 168/89, pulse 87, temperature 97.8 F (36.6 C), temperature source Axillary, resp. rate 18, height '5\' 3"'$  (1.6 m), weight 60.3 kg, SpO2 100 %. Last Weight  Most recent update: 10/01/2021 12:47 PM    Weight  60.3 kg (133 lb)             CONSTITUTIONAL: Well developed, and nourished, appropriately responsive and aware without distress.  NG tube in place. EYES: Sclera non-icteric.   EARS, NOSE, MOUTH AND THROAT:  The oropharynx is clear. Oral mucosa is pink and moist.   Hearing is intact to voice.  NECK: Trachea is midline, and there is no jugular venous distension.  LYMPH NODES:  Lymph nodes in the neck are not enlarged. RESPIRATORY:  Lungs are clear, and breath sounds are equal bilaterally. Normal respiratory effort without pathologic use of accessory muscles. CARDIOVASCULAR: Heart is regular in rate and rhythm. GI: The abdomen is notable for right lower quadrant incision with acute healing.  There is a vague tender area in this region,  attempts to reduce the underlying mass were unsuccessful and only inflicted/increased pain.  Otherwise soft, nontender, and nondistended.  MUSCULOSKELETAL:  Symmetrical muscle tone appreciated in all four extremities.    SKIN: Skin turgor is normal. No pathologic skin lesions appreciated.  NEUROLOGIC:  Motor and sensation appear grossly normal.  Cranial nerves are grossly without defect. PSYCH:  Alert and oriented to person, place and time. Affect is appropriate for situation.  Data Reviewed I have personally reviewed what is currently available of the patient's imaging, recent labs and medical records.   Labs:     Latest Ref Rng &  Units 10/01/2021    1:10 PM 03/11/2018    4:42 AM 03/10/2018    4:38 AM  CBC  WBC 4.0 - 10.5 K/uL 9.7   7.7   19.5    Hemoglobin 12.0 - 15.0 g/dL 14.8   12.4   13.4    Hematocrit 36.0 - 46.0 % 44.1   39.3   41.4    Platelets 150 - 400 K/uL 379   313   341        Latest Ref Rng & Units 10/01/2021    1:10 PM 03/10/2018    4:38 AM 03/09/2018    7:36 PM  CMP  Glucose 70 - 99 mg/dL 115   95   112    BUN 8 - 23 mg/dL '11   6   8    '$ Creatinine 0.44 - 1.00 mg/dL 0.66   0.69   0.63    Sodium 135 - 145 mmol/L 128   135   134    Potassium 3.5 - 5.1 mmol/L 3.6   4.0   3.5    Chloride 98 - 111 mmol/L 92   102   101    CO2 22 - 32 mmol/L '26   28   26    '$ Calcium 8.9 - 10.3 mg/dL 9.3   8.6   8.9    Total Protein 6.5 - 8.1 g/dL 7.3    6.9    Total Bilirubin 0.3 - 1.2 mg/dL 0.6    0.4    Alkaline Phos 38 - 126 U/L 59    66    AST 15 - 41 U/L 22    19    ALT 0 - 44 U/L 25    19        Imaging:  Within last 24 hrs: DG Abd 1 View  Result Date: 10/01/2021 CLINICAL DATA:  NG tube placement. EXAM: ABDOMEN - 1 VIEW COMPARISON:  Abdominal radiograph and CT earlier today FINDINGS: The enteric tube has been adjusted/advanced and now projects over the proximal gastric body. Multiple mildly dilated loops of small bowel are again noted in the included portion of the abdomen. The included portions of the lungs are grossly clear. IMPRESSION: Interval advancement of the enteric tube into the stomach. Electronically Signed   By: Logan Bores M.D.   On: 10/01/2021 17:11   DG Abdomen 1 View  Result Date: 10/01/2021 CLINICAL DATA:  NG tube placement. EXAM: ABDOMEN - 1 VIEW COMPARISON:  CT abdomen and pelvis 10/01/2021 FINDINGS: An enteric tube is folded upon itself in the distal esophagus. There are multiple mildly dilated loops of small bowel in the included portion of the abdomen as seen on today's CT. The included portions of the lungs are grossly clear. IMPRESSION: Enteric tube folded upon itself in the distal  esophagus. This has been adjusted on a subsequent radiograph. Electronically Signed  By: Logan Bores M.D.   On: 10/01/2021 17:10   CT ABDOMEN PELVIS W CONTRAST  Result Date: 10/01/2021 CLINICAL DATA:  Abdominal pain, acute, nonlocalized EXAM: CT ABDOMEN AND PELVIS WITH CONTRAST TECHNIQUE: Multidetector CT imaging of the abdomen and pelvis was performed using the standard protocol following bolus administration of intravenous contrast. RADIATION DOSE REDUCTION: This exam was performed according to the departmental dose-optimization program which includes automated exposure control, adjustment of the mA and/or kV according to patient size and/or use of iterative reconstruction technique. CONTRAST:  155m OMNIPAQUE IOHEXOL 300 MG/ML  SOLN COMPARISON:  CT 03/29/2018. FINDINGS: Lower chest: No acute abnormality. Hepatobiliary: Unchanged right hepatic cyst. Cholelithiasis. The gallbladder is contracted. Pancreas: Unremarkable. No pancreatic ductal dilatation or surrounding inflammatory changes. Spleen: Normal in size without focal abnormality. Adrenals/Urinary Tract: Adrenal glands are unremarkable. Unchanged right renal cyst. Unchanged rotated left kidney with mild prominence of the left renal collecting system. No frank hydronephrosis. No nephrolithiasis. The bladder is well distended with small amount of intraluminal gas, potentially from recent catheterization. Stomach/Bowel: Small hiatal hernia. Bowel containing right lower quadrant hernia, containing a prominent loop of fluid-filled small bowel and with adjacent inflammatory stranding. There is upstream prominent small bowel with air-fluid levels.The appendix is normal. There is extensive diverticulosis of the colon, most prominent in the sigmoid, without diverticulitis. Vascular/Lymphatic: Scattered atherosclerosis.  No lymphadenopathy. Reproductive: Postsurgical changes of recent hysterectomy. There is asymmetric right inflammatory stranding in the pelvis,  but there is no well-defined/drainable collection. Other: There is some stranding inferiorly to this related to prior laparoscopic port incision. Focal skin thickening from prior laparoscopic port sites from recent surgery are also noted along the lower anterior abdominal wall on the right and the left. There is a fat containing umbilical hernia with a 1.8 cm aperture. Musculoskeletal: No acute osseous abnormality. No suspicious lytic or blastic lesions. Multilevel degenerative changes spine worst at L5-S1. Mild bilateral hip osteoarthritis. There is subcutaneous soft tissue gas along the anterior abdominal wall extending up to the chest likely related to recent laparoscopic surgery. IMPRESSION: Spigelian-type right lower quadrant hernia containing a prominent fluid-filled loop of small bowel with features concerning for incarceration, correlate with reducibility. Upstream fluid-filled and prominent small bowel with air-fluid levels concerning for early/partial small bowel obstruction. Recent hysterectomy with asymmetric stranding in the right hemipelvis but no well-defined/drainable fluid collection. Electronically Signed   By: JMaurine SimmeringM.D.   On: 10/01/2021 15:01    Assessment    Small bowel obstruction secondary to acute postoperative incisional hernia. Patient Active Problem List   Diagnosis Date Noted   Acute diverticulitis 03/10/2018   Diverticulitis of intestine with abscess 03/03/2018    Plan    Robotic assisted laparoscopy, with anticipated reduction of small bowel and primary repair of fascial defect. Risks discussed with patient and her husband at bedside.  These include but are not limited to anesthesia, bleeding, potential bowel injury, potential bowel compromise requiring resection or repair, recurrence of hernia, infection, etc.  I believe they understand these are not all-inclusive, have had their questions adequately answered, and desire to proceed. Face-to-face time spent with the  patient and accompanying care providers(if present) was 30 minutes, with more than 50% of the time spent counseling, educating, and coordinating care of the patient.    These notes generated with voice recognition software. I apologize for typographical errors.  DRonny BaconM.D., FACS 10/01/2021, 6:58 PM

## 2021-10-01 NOTE — Anesthesia Procedure Notes (Signed)
Procedure Name: Intubation Date/Time: 10/01/2021 11:06 PM Performed by: Lendon Colonel, CRNA Pre-anesthesia Checklist: Patient identified, Patient being monitored, Timeout performed, Emergency Drugs available and Suction available Patient Re-evaluated:Patient Re-evaluated prior to induction Oxygen Delivery Method: Circle system utilized Preoxygenation: Pre-oxygenation with 100% oxygen Induction Type: IV induction and Rapid sequence Laryngoscope Size: 3 and McGraph Grade View: Grade I Tube type: Oral Tube size: 7.0 mm Number of attempts: 1 Airway Equipment and Method: Stylet Placement Confirmation: ETT inserted through vocal cords under direct vision, positive ETCO2 and breath sounds checked- equal and bilateral Secured at: 20 cm Tube secured with: Tape Dental Injury: Teeth and Oropharynx as per pre-operative assessment

## 2021-10-01 NOTE — Op Note (Signed)
Robotic assisted laparoscopic small bowel resection and anterior abdominal wall hernia repair, total fascial defect 3.5 cm   Pre-operative Diagnosis: Small bowel obstruction secondary to incarcerated/strangulated acute incisional hernia of 1.5 cm   Post-operative Diagnosis: same, with supraumbilical hernia of 2.0 cm.   Surgeon:  Ronny Bacon, M.D., FACS   Anesthesia: Gen. with endotracheal tube   Findings: Omental adhesions to umbilical fascial closure, incarcerated falciform ligament and supraumbilical fascial defect of 2 cm.  Right lower quadrant incisional hernia of 1.5 cm diameter with strangulated small bowel.  Estimated Blood Loss: 16XW   Complications: none           Procedure Details  The patient was seen again in the Holding Room. The benefits, complications, treatment options, and expected outcomes were discussed with the patient. The risks of bleeding, infection, recurrence of symptoms, failure to resolve symptoms, bowel injury, any of which could require further surgery were reviewed with the patient. The likelihood of improving the patient's symptoms with return to their baseline status is good.  The patient and/or family concurred with the proposed plan, giving informed consent.  The patient was taken to Operating Room, identified and the procedure verified.  A Time Out was held and the above information confirmed.   Prior to the induction of general anesthesia, antibiotic prophylaxis was administered. VTE prophylaxis was in place. General endotracheal anesthesia was then administered and tolerated well. After the induction, the abdomen was prepped with Chloraprep and draped in the sterile fashion. The patient was positioned in the supine position. After local infiltration of quarter percent Marcaine with epinephrine, stab incision was made left upper quadrant.  Just below the costal margin approximately midclavicular line the Veress needle is passed with sensation of the layers  to penetrate the abdominal wall and into the peritoneum.  Saline drop test is confirmed peritoneal placement.  Insufflation is initiated with carbon dioxide to pressures of 15 mmHg. We used a left upper quadrant subcostal incision and using an optical FIOS 11 mm trocar, it was inserted with direct visualization and pneumoperitoneum obtained.  No hemodynamic compromise, no evidence of intraperitoneal injury.   2 additional 8 mm ports were placed under direct visualization on the left lateral abdomen.  I visualized the hernias and there was a right lower quadrant hernia with small bowel entering it and exiting it, firmly stuck, attempts to reduce it after induction of anesthesia were not successful.   The robot was brought to the surgical field and docked in the standard fashion.  We made sure that all instrumentation was kept under direct vision at all times and there was no collision between the arms.  I scrubbed out and went to the console. There was an omental adhesion to the for local fascial closure, but this did not explain the mass I could feel cephalad to the umbilicus. I proceeded with dividing the omental adhesion so that we could have better access to the right lower quadrant.  With fenestrated graspers, tips up and fenestrated bipolar, I proceeded to cautiously withdraw the small bowel from the right lower quadrant defect.  It was quite stuck, I was able to visualize the different decompressed limb, which I thought might give more easily, when it did so it was clear that there was rupture and purple-black discoloration with spillage of enteric contents. I had anesthesia give 5 mg of ICG after giving an interval of time for possible restoration of circulation.  At best it was patchy, and I felt it prudent rather  than repairing a compromised defect in the small bowel to resect this compromised area. I scrubbed back in order to add a 12 mm port as a fourth arm.  I swapped out the previous 11 mm trocar  in the left subcostal site.  I added an 8 mm trocar in the suprapubic area to utilize 4 arms. Utilizing the vessel sealer, I proceeded with division of the compromise segment of small intestine.  I then divided the mesentery to that segment and set it aside.  I then proceeded to create a common enterotomy utilizing a 40 mm blue GIA robotic stapler, and then closed that common enterotomy with a second firing of the stapler at right angle.  It appeared that I had a large common channel and had excellent opposition of bowel wall at all portions of the anastomosis.  Additional ICG confirmed excellent vascularity of the anastomosis. Falciform and adjacent preperitoneal adipose incarcerated in the supraumbilical defect were taken down with electrocautery to allow reapproximation of the fascia.  Confirm and measured that the defects measured 1.5 of the right lower quadrant defect, and 2 cm of the supraumbilical defect.  Using a 0 Stratafix sutures we closed the anterior abdominal wall defects primarily.    All the needles were removed under direct visualization.  The instruments were removed and the robot was undocked.   Marcaine quarter percent with Exparel was used to inject all the incision sites.  The fascial sutures approximated in the standard fashion, with 0 Vicryl, utilizing the PMI under direct visualization.  The laparoscopic ports were removed under direct visualization and the pneumoperitoneum was deflated.  Incisions were closed with  4-0 Monocryl   Dermabond was used to coat the skin.  Patient tolerated procedure well and there were no immediate complications. Needle and laparotomy counts were correct    Ronny Bacon M.D., [facility] 10/01/2021 11:12 PM

## 2021-10-01 NOTE — ED Provider Triage Note (Signed)
Emergency Medicine Provider Triage Evaluation Note  Sharon Pittman , a 76 y.o. female  was evaluated in triage.  Pt complains of lower abdominal pain with nausea after having hysterectomy and bladder tack on 5/15. Today, she has developed lower pelvic pain and is nauseated. No fever.  Review of Systems  Positive: Abdominal pain Negative: Fever  Physical Exam  BP (!) 188/89 (BP Location: Left Arm)   Pulse 70   Temp 97.8 F (36.6 C) (Axillary)   Resp (!) 22   Ht '5\' 3"'$  (1.6 m)   Wt 60.3 kg   SpO2 100%   BMI 23.56 kg/m  Gen:   Awake, no distress   Resp:  Normal effort  MSK:   Moves extremities without difficulty  Other:    Medical Decision Making  Medically screening exam initiated at 12:55 PM.  Appropriate orders placed.  Sharon Pittman was informed that the remainder of the evaluation will be completed by another provider, this initial triage assessment does not replace that evaluation, and the importance of remaining in the ED until their evaluation is complete.  Pain medications and CT abdomen and pelvis ordered    Victorino Dike, FNP 10/01/21 1355

## 2021-10-01 NOTE — ED Notes (Signed)
Pt having abdominal pain and vomiting. Vomiting just started while in ED according to visitor with pt.

## 2021-10-01 NOTE — Anesthesia Preprocedure Evaluation (Addendum)
Anesthesia Evaluation  Patient identified by MRN, date of birth, ID band Patient awake    Reviewed: Allergy & Precautions, H&P , NPO status , Patient's Chart, lab work & pertinent test results, reviewed documented beta blocker date and time   Airway Mallampati: III   Neck ROM: full    Dental no notable dental hx.    Pulmonary neg pulmonary ROS,    Pulmonary exam normal        Cardiovascular Exercise Tolerance: Good hypertension, Pt. on medications Normal cardiovascular exam     Neuro/Psych negative neurological ROS  negative psych ROS   GI/Hepatic Neg liver ROS, Incarcerated hernia   Endo/Other  negative endocrine ROS  Renal/GU negative Renal ROS  negative genitourinary   Musculoskeletal   Abdominal (+)  Abdomen: soft.  Tender to right lower quandrant with hernia  Peds  Hematology negative hematology ROS (+)   Anesthesia Other Findings Hyponatremia  Past Medical History:   Anxiety                                                      Elevated lipids                                            Past Surgical History:   uretheral polyp                                                 Comment:excision/ fulguration    Reproductive/Obstetrics                            Anesthesia Physical  Anesthesia Plan  ASA: 2  Anesthesia Plan: General   Post-op Pain Management: Toradol IV (intra-op)* and Ofirmev IV (intra-op)*   Induction: Intravenous  PONV Risk Score and Plan: 2  Airway Management Planned: Oral ETT  Additional Equipment:   Intra-op Plan:   Post-operative Plan: Extubation in OR  Informed Consent: I have reviewed the patients History and Physical, chart, labs and discussed the procedure including the risks, benefits and alternatives for the proposed anesthesia with the patient or authorized representative who has indicated his/her understanding and acceptance.      Dental Advisory Given  Plan Discussed with: CRNA  Anesthesia Plan Comments:         Anesthesia Quick Evaluation

## 2021-10-01 NOTE — Transfer of Care (Signed)
Immediate Anesthesia Transfer of Care Note  Patient: Sharon Pittman  Procedure(s) Performed: XI ROBOT ASSISTED Small bowel resection (Abdomen) repair anterior wall hernias 3.5cm (Abdomen) INDOCYANINE GREEN FLUORESCENCE IMAGING (ICG)  Patient Location: PACU  Anesthesia Type:General  Level of Consciousness: drowsy and patient cooperative  Airway & Oxygen Therapy: Patient Spontanous Breathing and Patient connected to face mask oxygen  Post-op Assessment: Report given to RN and Post -op Vital signs reviewed and stable  Post vital signs: Reviewed and stable  Last Vitals:  Vitals Value Taken Time  BP 138/78 10/01/21 2315  Temp    Pulse 94 10/01/21 2320  Resp 18 10/01/21 2320  SpO2 100 % 10/01/21 2320  Vitals shown include unvalidated device data.  Last Pain:  Vitals:   10/01/21 1254  TempSrc: Axillary  PainSc:          Complications: No notable events documented.

## 2021-10-01 NOTE — ED Provider Notes (Signed)
[facility] Provider Note   Event Date/Time   First MD Initiated Contact with Patient 10/01/21 1504     (approximate) History  Abdominal Pain and Post-op Problem  HPI Sharon Pittman is a 76 y.o. female with a recent laparoscopic supracervical hysterectomy/oophorectomy, colpopexy, and bladder sling on 09/22/2021 who presents for severe right lower quadrant abdominal pain with associated bloating and nausea/vomiting that began this morning and is described as 10/10, sharp pain that radiates from the right lower quadrant into the center and upper abdomen.  Patient states that initially this pain was somewhat resolved after eating small amount of breakfast however it returned and worsened soon afterwards.  Patient denies any fevers, shortness of breath, chest pain, dysuria, or bowel/bladder incontinence Physical Exam  Triage Vital Signs: ED Triage Vitals  Enc Vitals Group     BP 10/01/21 1254 (!) 188/89     Pulse Rate 10/01/21 1254 70     Resp 10/01/21 1254 (!) 22     Temp 10/01/21 1254 97.8 F (36.6 C)     Temp Source 10/01/21 1254 Axillary     SpO2 10/01/21 1254 100 %     Weight 10/01/21 1247 133 lb (60.3 kg)     Height 10/01/21 1247 '5\' 3"'$  (1.6 m)     Head Circumference --      Peak Flow --      Pain Score 10/01/21 1247 10     Pain Loc --      Pain Edu? --      Excl. in [place]? --    Most recent vital signs: Vitals:   10/01/21 1254 10/01/21 1656  BP: (!) 188/89 (!) 168/89  Pulse: 70 87  Resp: (!) 22 18  Temp: 97.8 F (36.6 C)   SpO2: 100% 100%   General: Awake, oriented x4. CV:  Good peripheral perfusion.  Resp:  Normal effort.  Abd:  No distention.  Tenderness to palpation and palpable rounded mass appreciated in the right lower quadrant that is irreducible Other:  Patient is an elderly Caucasian female laying in bed in mild distress secondary to pain and vomiting ED Results / Procedures / Treatments  Labs (all labs ordered are listed, but only  abnormal results are displayed) Labs Reviewed  COMPREHENSIVE METABOLIC PANEL - Abnormal; Notable for the following components:      Result Value   Sodium 128 (*)    Chloride 92 (*)    Glucose, Bld 115 (*)    All other components within normal limits  CBC - Abnormal; Notable for the following components:   RBC 5.15 (*)    All other components within normal limits  URINALYSIS, ROUTINE W REFLEX MICROSCOPIC - Abnormal; Notable for the following components:   Color, Urine STRAW (*)    APPearance CLEAR (*)    Glucose, UA 50 (*)    Hgb urine dipstick SMALL (*)    Ketones, ur 20 (*)    Leukocytes,Ua TRACE (*)    All other components within normal limits  LIPASE, BLOOD  SURGICAL PATHOLOGY  RADIOLOGY ED MD interpretation: CT scan of the abdomen and pelvis with IV contrast interpreted by me and shows a spigelian type right lower quadrant hernia containing a prominent fluid-filled loop of small bowel concerning for incarceration.  There is also upstream fluid filled and prominent small bowel with air-fluid levels concerning for a small bowel obstruction  Single view abdominal x-ray interpreted by me after NG tube placement shows enteric tube folded upon  itself in the distal esophagus.  Repeat single view abdominal x-ray interpreted by me after NG tube adjustment shows advancement of the enteric tube into the stomach -Agree with radiology assessment Official radiology report(s): DG Abd 1 View  Result Date: 10/01/2021 CLINICAL DATA:  NG tube placement. EXAM: ABDOMEN - 1 VIEW COMPARISON:  Abdominal radiograph and CT earlier today FINDINGS: The enteric tube has been adjusted/advanced and now projects over the proximal gastric body. Multiple mildly dilated loops of small bowel are again noted in the included portion of the abdomen. The included portions of the lungs are grossly clear. IMPRESSION: Interval advancement of the enteric tube into the stomach. Electronically Signed   By: Logan Bores M.D.    On: 10/01/2021 17:11   DG Abdomen 1 View  Result Date: 10/01/2021 CLINICAL DATA:  NG tube placement. EXAM: ABDOMEN - 1 VIEW COMPARISON:  CT abdomen and pelvis 10/01/2021 FINDINGS: An enteric tube is folded upon itself in the distal esophagus. There are multiple mildly dilated loops of small bowel in the included portion of the abdomen as seen on today's CT. The included portions of the lungs are grossly clear. IMPRESSION: Enteric tube folded upon itself in the distal esophagus. This has been adjusted on a subsequent radiograph. Electronically Signed   By: Logan Bores M.D.   On: 10/01/2021 17:10   CT ABDOMEN PELVIS W CONTRAST  Result Date: 10/01/2021 CLINICAL DATA:  Abdominal pain, acute, nonlocalized EXAM: CT ABDOMEN AND PELVIS WITH CONTRAST TECHNIQUE: Multidetector CT imaging of the abdomen and pelvis was performed using the standard protocol following bolus administration of intravenous contrast. RADIATION DOSE REDUCTION: This exam was performed according to the departmental dose-optimization program which includes automated exposure control, adjustment of the mA and/or kV according to patient size and/or use of iterative reconstruction technique. CONTRAST:  154m OMNIPAQUE IOHEXOL 300 MG/ML  SOLN COMPARISON:  CT 03/29/2018. FINDINGS: Lower chest: No acute abnormality. Hepatobiliary: Unchanged right hepatic cyst. Cholelithiasis. The gallbladder is contracted. Pancreas: Unremarkable. No pancreatic ductal dilatation or surrounding inflammatory changes. Spleen: Normal in size without focal abnormality. Adrenals/Urinary Tract: Adrenal glands are unremarkable. Unchanged right renal cyst. Unchanged rotated left kidney with mild prominence of the left renal collecting system. No frank hydronephrosis. No nephrolithiasis. The bladder is well distended with small amount of intraluminal gas, potentially from recent catheterization. Stomach/Bowel: Small hiatal hernia. Bowel containing right lower quadrant hernia,  containing a prominent loop of fluid-filled small bowel and with adjacent inflammatory stranding. There is upstream prominent small bowel with air-fluid levels.The appendix is normal. There is extensive diverticulosis of the colon, most prominent in the sigmoid, without diverticulitis. Vascular/Lymphatic: Scattered atherosclerosis.  No lymphadenopathy. Reproductive: Postsurgical changes of recent hysterectomy. There is asymmetric right inflammatory stranding in the pelvis, but there is no well-defined/drainable collection. Other: There is some stranding inferiorly to this related to prior laparoscopic port incision. Focal skin thickening from prior laparoscopic port sites from recent surgery are also noted along the lower anterior abdominal wall on the right and the left. There is a fat containing umbilical hernia with a 1.8 cm aperture. Musculoskeletal: No acute osseous abnormality. No suspicious lytic or blastic lesions. Multilevel degenerative changes spine worst at L5-S1. Mild bilateral hip osteoarthritis. There is subcutaneous soft tissue gas along the anterior abdominal wall extending up to the chest likely related to recent laparoscopic surgery. IMPRESSION: Spigelian-type right lower quadrant hernia containing a prominent fluid-filled loop of small bowel with features concerning for incarceration, correlate with reducibility. Upstream fluid-filled and prominent small bowel with  air-fluid levels concerning for early/partial small bowel obstruction. Recent hysterectomy with asymmetric stranding in the right hemipelvis but no well-defined/drainable fluid collection. Electronically Signed   By: Maurine Simmering M.D.   On: 10/01/2021 15:01   PROCEDURES: Critical Care performed: Yes, see critical care procedure note(s) .1-3 Lead EKG Interpretation Performed by: Naaman Plummer, MD Authorized by: Naaman Plummer, MD     Interpretation: normal     ECG rate:  88   ECG rate assessment: normal     Rhythm: sinus  rhythm     Ectopy: none     Conduction: normal   NG placement  Date/Time: 10/01/2021 10:52 PM Performed by: Naaman Plummer, MD Authorized by: Naaman Plummer, MD  Consent: The procedure was performed in an emergent situation. Verbal consent obtained. Risks and benefits: risks, benefits and alternatives were discussed Consent given by: patient Patient understanding: patient states understanding of the procedure being performed Imaging studies: imaging studies available Patient identity confirmed: verbally with patient Time out: Immediately prior to procedure a "time out" was called to verify the correct patient, procedure, equipment, support staff and site/side marked as required. Preparation: Patient was prepped and draped in the usual sterile fashion. Local anesthesia used: no  Anesthesia: Local anesthesia used: no  Sedation: Patient sedated: no  Patient tolerance: patient tolerated the procedure well with no immediate complications   CRITICAL CARE Performed by: Naaman Plummer  Total critical care time: 47 minutes  Critical care time was exclusive of separately billable procedures and treating other patients.  Critical care was necessary to treat or prevent imminent or life-threatening deterioration.  Critical care was time spent personally by me on the following activities: development of treatment plan with patient and/or surrogate as well as nursing, discussions with consultants, evaluation of patient's response to treatment, examination of patient, obtaining history from patient or surrogate, ordering and performing treatments and interventions, ordering and review of laboratory studies, ordering and review of radiographic studies, pulse oximetry and re-evaluation of patient's condition.  MEDICATIONS ORDERED IN ED: Medications  acetaminophen (OFIRMEV) IV 1,000 mg (has no administration in time range)  promethazine (PHENERGAN) injection 6.25 mg (has no administration in  time range)  droperidol (INAPSINE) 2.5 MG/ML injection 0.625 mg (has no administration in time range)  HYDROmorphone (DILAUDID) injection 0.25-0.5 mg (has no administration in time range)  sodium chloride irrigation 0.9 % (3,000 mLs Irrigation Given 10/01/21 2229)  bupivacaine-epinephrine (PF) (MARCAINE W/ EPI) 0.25% -1:200000 30 mL, bupivacaine liposome (EXPAREL) 1.3 % 20 mL (50 mLs Injection Given 10/01/21 2249)  fentaNYL (SUBLIMAZE) injection 50 mcg (50 mcg Intravenous Given 10/01/21 1756)  ondansetron (ZOFRAN) injection 4 mg (4 mg Intravenous Given 10/01/21 1308)  fentaNYL (SUBLIMAZE) injection 50 mcg (50 mcg Intravenous Given 10/01/21 1355)  iohexol (OMNIPAQUE) 300 MG/ML solution 100 mL (100 mLs Intravenous Contrast Given 10/01/21 1418)  ondansetron (ZOFRAN) injection 4 mg (4 mg Intravenous Given 10/01/21 1532)  morphine (PF) 4 MG/ML injection 4 mg (4 mg Intravenous Given 10/01/21 1534)  sodium chloride 0.9 % bolus 1,000 mL (1,000 mLs Intravenous New Bag/Given 10/01/21 1532)   IMPRESSION / MDM / ASSESSMENT AND PLAN / ED COURSE  I reviewed the triage vital signs and the nursing notes.                             The patient is on the cardiac monitor to evaluate for evidence of arrhythmia and/or significant heart rate changes. Given  History, Exam I believe patient needs labs and imaging to evaluate for SBO vs other acute abdomen. ED Workup: CBC, BMP, LFTs, CT Abdomen/Pelvis ED Findings: CT: Small Bowel Obstruction and incarcerated spigelian hernia  History, Exam, and Workup show no overt evidence of mesenteric ischemia, bowel gangrene, abscess, peritonitis. ED Interventions: Analgesia. Defer ABX at this time. Consult: General Surgery who recommends possible transfer to [facility] as this is where patient originally got her surgery on 09/22/2021 I spoke to Dr. Rolena Infante through the transfer center and general surgery at [facility] who refused transfer as she stated patient would not be in the OR any sooner at  [facility] and she would be at our hospital Spoke with Dr. Christian Mate again who graciously agreed to take this patient to the OR for further management Disposition: to OR    FINAL CLINICAL IMPRESSION(S) / ED DIAGNOSES   Final diagnoses:  Right lower quadrant abdominal pain  Nausea and vomiting, unspecified vomiting type  Small bowel obstruction ([place])  Spigelian hernia with bowel obstruction   Rx / DC Orders   ED Discharge Orders     None      Note:  This document was prepared using Dragon voice recognition software and may include unintentional dictation errors.   Naaman Plummer, MD 10/01/21 [phone]

## 2021-10-02 ENCOUNTER — Encounter: Payer: Self-pay | Admitting: Surgery

## 2021-10-02 LAB — CBC
HCT: 40.6 % (ref 36.0–46.0)
Hemoglobin: 13.7 g/dL (ref 12.0–15.0)
MCH: 29.3 pg (ref 26.0–34.0)
MCHC: 33.7 g/dL (ref 30.0–36.0)
MCV: 86.8 fL (ref 80.0–100.0)
Platelets: 346 10*3/uL (ref 150–400)
RBC: 4.68 MIL/uL (ref 3.87–5.11)
RDW: 13 % (ref 11.5–15.5)
WBC: 18.3 10*3/uL — ABNORMAL HIGH (ref 4.0–10.5)
nRBC: 0 % (ref 0.0–0.2)

## 2021-10-02 LAB — BASIC METABOLIC PANEL
Anion gap: 10 (ref 5–15)
BUN: 11 mg/dL (ref 8–23)
CO2: 21 mmol/L — ABNORMAL LOW (ref 22–32)
Calcium: 7.6 mg/dL — ABNORMAL LOW (ref 8.9–10.3)
Chloride: 97 mmol/L — ABNORMAL LOW (ref 98–111)
Creatinine, Ser: 0.68 mg/dL (ref 0.44–1.00)
GFR, Estimated: >60 mL/min (ref >60)
Glucose, Bld: 159 mg/dL — ABNORMAL HIGH (ref 70–99)
Potassium: 3.3 mmol/L — ABNORMAL LOW (ref 3.5–5.1)
Sodium: 128 mmol/L — ABNORMAL LOW (ref 135–145)

## 2021-10-02 MED ORDER — OXYCODONE-ACETAMINOPHEN 5-325 MG PO TABS
1.0000 | ORAL_TABLET | ORAL | Status: DC | PRN
  Administered 2021-10-03: 2
  Filled 2021-10-02: qty 2

## 2021-10-02 MED ORDER — ACETAMINOPHEN 650 MG RE SUPP: 650.0000 mg | Freq: Four times a day (QID) | RECTAL | Status: DC | PRN

## 2021-10-02 MED ORDER — CHLORTHALIDONE 25 MG PO TABS
25.0000 mg | ORAL_TABLET | ORAL | Status: DC
  Administered 2021-10-02: 25 mg via ORAL
  Filled 2021-10-02: qty 1

## 2021-10-02 MED ORDER — POTASSIUM CHLORIDE 20 MEQ PO PACK
20.0000 meq | PACK | Freq: Once | ORAL | Status: AC
  Administered 2021-10-02: 20 meq via ORAL
  Filled 2021-10-02: qty 1

## 2021-10-02 MED ORDER — FLUTICASONE PROPIONATE 50 MCG/ACT NA SUSP
1.0000 | Freq: Every day | NASAL | Status: DC
  Administered 2021-10-02 – 2021-10-03 (×2): 1 via NASAL
  Filled 2021-10-02: qty 16

## 2021-10-02 MED ORDER — ONDANSETRON HCL 4 MG/2ML IJ SOLN
4.0000 mg | Freq: Four times a day (QID) | INTRAMUSCULAR | Status: DC | PRN
  Administered 2021-10-03: 4 mg via INTRAVENOUS
  Filled 2021-10-02: qty 2

## 2021-10-02 MED ORDER — PANTOPRAZOLE SODIUM 40 MG IV SOLR
40.0000 mg | Freq: Every day | INTRAVENOUS | Status: DC
  Administered 2021-10-02 – 2021-10-04 (×4): 40 mg via INTRAVENOUS
  Filled 2021-10-02 (×5): qty 10

## 2021-10-02 MED ORDER — LACTATED RINGERS IV SOLN: INTRAVENOUS | Status: DC

## 2021-10-02 MED ORDER — HYDRALAZINE HCL 20 MG/ML IJ SOLN: 10.0000 mg | INTRAMUSCULAR | Status: DC | PRN

## 2021-10-02 MED ORDER — ACETAMINOPHEN 325 MG PO TABS
650.0000 mg | ORAL_TABLET | Freq: Four times a day (QID) | ORAL | Status: DC | PRN
  Administered 2021-10-05: 650 mg
  Filled 2021-10-02: qty 2

## 2021-10-02 MED ORDER — CHLORTHALIDONE 25 MG PO TABS
12.5000 mg | ORAL_TABLET | ORAL | Status: DC
  Administered 2021-10-06: 12.5 mg via ORAL
  Filled 2021-10-02: qty 0.5

## 2021-10-02 MED ORDER — CEFAZOLIN SODIUM-DEXTROSE 2-4 GM/100ML-% IV SOLN
2.0000 g | Freq: Three times a day (TID) | INTRAVENOUS | Status: AC
  Administered 2021-10-02: 2 g via INTRAVENOUS
  Filled 2021-10-02 (×2): qty 100

## 2021-10-02 MED ORDER — HYDROMORPHONE HCL 1 MG/ML IJ SOLN
0.5000 mg | INTRAMUSCULAR | Status: DC | PRN
  Administered 2021-10-02: 0.5 mg via INTRAVENOUS
  Filled 2021-10-02: qty 0.5

## 2021-10-02 MED ORDER — ONDANSETRON 4 MG PO TBDP: 4.0000 mg | ORAL_TABLET | Freq: Four times a day (QID) | ORAL | Status: DC | PRN

## 2021-10-02 MED ORDER — CALCIUM CARBONATE 1250 (500 CA) MG PO TABS
1.0000 | ORAL_TABLET | Freq: Every day | ORAL | Status: DC
  Administered 2021-10-02 – 2021-10-06 (×5): 500 mg via ORAL
  Filled 2021-10-02 (×5): qty 1

## 2021-10-02 MED ORDER — ALPRAZOLAM 0.25 MG PO TABS
0.2500 mg | ORAL_TABLET | Freq: Every evening | ORAL | Status: DC | PRN
  Administered 2021-10-03 – 2021-10-04 (×2): 0.25 mg via ORAL
  Filled 2021-10-02 (×3): qty 1

## 2021-10-02 NOTE — Progress Notes (Signed)
Initial Nutrition Assessment  DOCUMENTATION CODES:   Not applicable  INTERVENTION:  - Will monitor for diet advancement and add nutrition supplements as appropriate  - If unable to advance diet, recommend initiation of TPN  NUTRITION DIAGNOSIS:   Inadequate oral intake related to altered GI function as evidenced by NPO status  GOAL:   Patient will meet greater than or equal to 90% of their needs  MONITOR:   Diet advancement, Labs, Weight trends, I & O's  REASON FOR ASSESSMENT:   Malnutrition Screening Tool    ASSESSMENT:   Pt admitted with postoperative lower abdominal pain with vomiting. Found to have SBO secondary to incarcerated incisional hernia in R lower quadrant PMH significant for recent laparoscopic assisted GYN procedures 9 days ago.  05/24: s/p small bowel resection and anterior abdominal wall hernia repair  Pt sitting in bed with daughter at bedside. She states that she was eating well at baseline up until last night when she began feeling abdominal pain which she believed to be gas. Her typical PO intake includes 3 meals a day consisting of kashi cereal with a banana and boiled egg or oatmeal with fruit and a boiled egg for breakfast; lunch and dinner vary but include a variety of food groups. Pt also endorses drinking Muscle Milk on occasion. She reports that up until her surgery 2 weeks ago, she was exercising daily and attending workout classes.   Pt states that her usual wt is 133-137 lbs and endorses minimal wt loss. Reviewed chart for documented wt hx. At pt's office visit 04/03 she was noted to weigh 62.5 kg. Current wt noted to be 60.3 kg.   Medications: calcium carbonate '500mg'$  daily, protonix, LR @ 75 ml/hr  Labs: sodium 128, potassium 3.3, calcium 7.6  NGT to LIS: 90 ml x12 hours  NUTRITION - FOCUSED PHYSICAL EXAM:  Flowsheet Row Most Recent Value  Orbital Region No depletion  Upper Arm Region No depletion  Thoracic and Lumbar Region No  depletion  Buccal Region No depletion  Temple Region No depletion  Clavicle Bone Region No depletion  Clavicle and Acromion Bone Region No depletion  Scapular Bone Region No depletion  Dorsal Hand No depletion  Patellar Region Mild depletion  Anterior Thigh Region Mild depletion  Posterior Calf Region No depletion  Edema (RD Assessment) None  Hair Reviewed  Eyes Reviewed  Mouth Reviewed  Skin Reviewed  Nails Reviewed      Diet Order:   Diet Order             Diet NPO time specified Except for: Ice Chips, Sips with Meds  Diet effective now                   EDUCATION NEEDS:   Education needs have been addressed  Skin:  Skin Assessment: Skin Integrity Issues: Skin Integrity Issues:: Incisions Incisions: abdomen (closed)  Last BM:  5/24  Height:   Ht Readings from Last 1 Encounters:  10/01/21 '5\' 3"'$  (1.6 m)    Weight:   Wt Readings from Last 1 Encounters:  10/01/21 60.3 kg   BMI:  Body mass index is 23.56 kg/m.  Estimated Nutritional Needs:   Kcal:  1700-1900  Protein:  85-100g  Fluid:  >/=1.7L  Clayborne Dana, RDN, LDN Clinical Nutrition

## 2021-10-02 NOTE — Plan of Care (Signed)
  Problem: Clinical Measurements: Goal: Ability to maintain clinical measurements within normal limits will improve Outcome: Progressing Goal: Will remain free from infection Outcome: Progressing Goal: Diagnostic test results will improve Outcome: Progressing Goal: Respiratory complications will improve Outcome: Progressing Goal: Cardiovascular complication will be avoided Outcome: Progressing   Problem: Pain Managment: Goal: General experience of comfort will improve Outcome: Progressing   Pt is involved in and agrees with the plan of care. V/S stable. No complaints of pain at this time. Abdominal binder in place. Incision site; clean and intact. NGT in place draining greenish output. Small crepitus palpated on pts upper chest near the clavicle. Denies SOB. Oxygen saturation 97-99% on RA.

## 2021-10-02 NOTE — Progress Notes (Signed)
[facility] Hospital Day(s): 1.   Post op day(s): 1 Day Post-Op.   Interval History: Patient seen and examined, no acute events or new complaints overnight. Patient requests NG tube removal.  I explained to her the reasons why I wanted to keep it for the next 24 hours or so.  She otherwise appears quite comfortable.  We are awaiting return of bowel function.  Leukocytosis, hopefully reactive.  Electrolyte abnormalities persist, likely secondary to small bowel obstruction.  Review of Systems:  Constitutional: denies fever, chills  Respiratory: denies any shortness of breath  Cardiovascular: denies chest pain or palpitations  Gastrointestinal: denies abdominal pain, N/V, or diarrhea/and bowel function as per interval history Musculoskeletal: denies pain, decreased motor or sensation Integumentary: denies any other rashes or skin discolorations  Vital signs in last 24 hours: [min-max] current  Temp:  [97.4 F (36.3 C)-98.4 F (36.9 C)] 98.1 F (36.7 C) (05/25 0823) Pulse Rate:  [70-104] 91 (05/25 0823) Resp:  [12-22] 18 (05/25 0823) BP: (94-188)/(56-89) 107/65 (05/25 0823) SpO2:  [96 %-100 %] 96 % (05/25 0823) Weight:  [60.3 kg] 60.3 kg (05/24 1247)     Height: '5\' 3"'$  (160 cm) Weight: 60.3 kg BMI (Calculated): 23.57   Intake/Output last 2 shifts:  05/24 0701 - 05/25 0700 In: 1356.6 [I.V.:1256.6; IV Piggyback:100] Out: 365 [Urine:275; Emesis/NG output:90]   Physical Exam:  Constitutional: alert, cooperative and no distress  Respiratory: breathing non-labored at rest  Cardiovascular: regular rate and sinus rhythm  Gastrointestinal: soft, non-tender, and non-distended Integumentary: Incisions, clean, dry and intact.  Labs:     Latest Ref Rng & Units 10/02/2021    4:34 AM 10/01/2021    1:10 PM 03/11/2018    4:42 AM  CBC  WBC 4.0 - 10.5 K/uL 18.3   9.7   7.7    Hemoglobin 12.0 - 15.0 g/dL 13.7   14.8   12.4    Hematocrit 36.0 - 46.0 % 40.6    44.1   39.3    Platelets 150 - 400 K/uL 346   379   313        Latest Ref Rng & Units 10/02/2021    4:34 AM 10/01/2021    1:10 PM 03/10/2018    4:38 AM  CMP  Glucose 70 - 99 mg/dL 159   115   95    BUN 8 - 23 mg/dL '11   11   6    '$ Creatinine 0.44 - 1.00 mg/dL 0.68   0.66   0.69    Sodium 135 - 145 mmol/L 128   128   135    Potassium 3.5 - 5.1 mmol/L 3.3   3.6   4.0    Chloride 98 - 111 mmol/L 97   92   102    CO2 22 - 32 mmol/L '21   26   28    '$ Calcium 8.9 - 10.3 mg/dL 7.6   9.3   8.6    Total Protein 6.5 - 8.1 g/dL  7.3     Total Bilirubin 0.3 - 1.2 mg/dL  0.6     Alkaline Phos 38 - 126 U/L  59     AST 15 - 41 U/L  22     ALT 0 - 44 U/L  25        Imaging studies: No new pertinent imaging studies   Assessment/Plan:  76 y.o. female with  1 Day Post-Op s/p robotic small bowel resection for  strangulated incisional hernia, complicated by pertinent comorbidities including:  Patient Active Problem List   Diagnosis Date Noted   S/P small bowel resection 10/01/2021   Acute diverticulitis 03/10/2018   Diverticulitis of intestine with abscess 03/03/2018    -Continue NG tube, monitor output, await restoration of bowel function.  -IV fluid fluids to maintain hydration.  -Prophylactic antibiotics to discontinue.  -Continue DVT prophylaxis, progressive ambulation as able.  All of the above findings and recommendations were discussed with the patient, and all of patient's questions were answered to their expressed satisfaction.  -- Ronny Bacon, M.D., [facility] 10/02/2021

## 2021-10-02 NOTE — Care Management Important Message (Signed)
Important Message  Patient Details  Name: Sharon Pittman MRN: [id number] Date of Birth: 1946-01-21   Medicare Important Message Given:  N/A - LOS <3 / Initial given by admissions     Dannette Barbara 10/02/2021, 2:16 PM

## 2021-10-02 NOTE — TOC Initial Note (Signed)
Transition of Care [facility]) - Initial/Assessment Note    Patient Details  Name: Sharon Pittman MRN: [id number] Date of Birth: [birth year]  Transition of Care [facility]) CM/SW Contact:    Beverly Sessions, RN Phone Number: 10/02/2021, 10:27 AM  Clinical Narrative:                   Transition of Care [facility]) Screening Note   Patient Details  Name: Sharon Pittman Date of Birth: July 31, 1945   Transition of Care [facility]) CM/SW Contact:    Beverly Sessions, RN Phone Number: 10/02/2021, 10:27 AM    Transition of Care Department [facility]) has reviewed patient and no TOC needs have been identified at this time. We will continue to monitor patient advancement through interdisciplinary progression rounds. If new patient transition needs arise, please place a TOC consult.         Patient Goals and CMS Choice        Expected Discharge Plan and Services                                                Prior Living Arrangements/Services                       Activities of Daily Living Home Assistive Devices/Equipment: None ADL Screening (condition at time of admission) Patient's cognitive ability adequate to safely complete daily activities?: Yes Is the patient deaf or have difficulty hearing?: No Does the patient have difficulty seeing, even when wearing glasses/contacts?: No Does the patient have difficulty concentrating, remembering, or making decisions?: No Patient able to express need for assistance with ADLs?: Yes Does the patient have difficulty dressing or bathing?: No Independently performs ADLs?: Yes (appropriate for developmental age) Does the patient have difficulty walking or climbing stairs?: No Weakness of Legs: None Weakness of Arms/Hands: None  Permission Sought/Granted                  Emotional Assessment              Admission diagnosis:  S/P small bowel resection [Z90.49] Patient Active Problem List   Diagnosis Date Noted   S/P  small bowel resection 10/01/2021   Acute diverticulitis 03/10/2018   Diverticulitis of intestine with abscess 03/03/2018   PCP:  Rusty Aus, MD Pharmacy:   [place], [place] [address] [place] [address] Phone: [phone] Fax: [phone]     Social Determinants of Health (SDOH) Interventions    Readmission Risk Interventions     View : No data to display.

## 2021-10-03 LAB — BASIC METABOLIC PANEL
Anion gap: 8 (ref 5–15)
BUN: 13 mg/dL (ref 8–23)
CO2: 27 mmol/L (ref 22–32)
Calcium: 8.6 mg/dL — ABNORMAL LOW (ref 8.9–10.3)
Chloride: 95 mmol/L — ABNORMAL LOW (ref 98–111)
Creatinine, Ser: 0.76 mg/dL (ref 0.44–1.00)
GFR, Estimated: >60 mL/min (ref >60)
Glucose, Bld: 106 mg/dL — ABNORMAL HIGH (ref 70–99)
Potassium: 3.2 mmol/L — ABNORMAL LOW (ref 3.5–5.1)
Sodium: 130 mmol/L — ABNORMAL LOW (ref 135–145)

## 2021-10-03 LAB — CBC
HCT: 37.9 % (ref 36.0–46.0)
Hemoglobin: 12.7 g/dL (ref 12.0–15.0)
MCH: 28.7 pg (ref 26.0–34.0)
MCHC: 33.5 g/dL (ref 30.0–36.0)
MCV: 85.7 fL (ref 80.0–100.0)
Platelets: 318 10*3/uL (ref 150–400)
RBC: 4.42 MIL/uL (ref 3.87–5.11)
RDW: 13.2 % (ref 11.5–15.5)
WBC: 16 10*3/uL — ABNORMAL HIGH (ref 4.0–10.5)
nRBC: 0 % (ref 0.0–0.2)

## 2021-10-03 LAB — SURGICAL PATHOLOGY

## 2021-10-03 MED ORDER — POTASSIUM CHLORIDE 20 MEQ PO PACK
20.0000 meq | PACK | Freq: Two times a day (BID) | ORAL | Status: AC
  Administered 2021-10-03 (×2): 20 meq via ORAL
  Filled 2021-10-03 (×2): qty 1

## 2021-10-03 MED ORDER — KCL IN DEXTROSE-NACL 20-5-0.9 MEQ/L-%-% IV SOLN
INTRAVENOUS | Status: DC
  Filled 2021-10-03 (×9): qty 1000

## 2021-10-03 MED ORDER — KCL IN DEXTROSE-NACL 20-5-0.45 MEQ/L-%-% IV SOLN
INTRAVENOUS | Status: DC
  Filled 2021-10-03: qty 1000

## 2021-10-03 MED ORDER — PROCHLORPERAZINE EDISYLATE 10 MG/2ML IJ SOLN
10.0000 mg | Freq: Four times a day (QID) | INTRAMUSCULAR | Status: DC | PRN
  Administered 2021-10-03: 10 mg via INTRAVENOUS
  Filled 2021-10-03: qty 2

## 2021-10-03 NOTE — Progress Notes (Signed)
[facility] Day(s): 2.   Post op day(s): 2 Days Post-Op.   Interval History: Patient seen and examined, no acute events or new complaints overnight. Patient wants NG tube removal.  Despite lack of flatus, I cautioned her that she would otherwise have to limit p.o. ice and take and she understands.  She otherwise appears quite comfortable, actively ambulating with son and ex-husband..  We are awaiting return of bowel function.  Leukocytosis, slightly decreased, hopefully reactive.  Electrolyte abnormalities, hypokalemia noted.    Review of Systems:  Constitutional: denies fever, chills  Respiratory: denies any shortness of breath  Cardiovascular: denies chest pain or palpitations  Gastrointestinal: denies uncontrolled abdominal pain, N/V, or diarrhea/and bowel function as per interval history Musculoskeletal: denies pain, decreased motor or sensation Integumentary: denies any other rashes or skin discolorations  Vital signs in last 24 hours: [min-max] current  Temp:  [98 F (36.7 C)-98.4 F (36.9 C)] 98 F (36.7 C) (05/26 0349) Pulse Rate:  [86-95] 86 (05/26 0349) Resp:  [18] 18 (05/26 0349) BP: (105-114)/(61-70) 114/70 (05/26 0349) SpO2:  [94 %-97 %] 94 % (05/26 0349)     Height: '5\' 3"'$  (160 cm) Weight: 60.3 kg BMI (Calculated): 23.57   Intake/Output last 2 shifts:  05/25 0701 - 05/26 0700 In: 1385.4 [I.V.:1385.4] Out: 700 [Emesis/NG output:700]   Physical Exam:  Constitutional: alert, cooperative and no distress  Respiratory: breathing non-labored at rest  Cardiovascular: regular rate and sinus rhythm  Gastrointestinal: soft, tender about incisions, and non-distended Integumentary: Incisions, clean, dry and intact.  Labs:     Latest Ref Rng & Units 10/03/2021    4:40 AM 10/02/2021    4:34 AM 10/01/2021    1:10 PM  CBC  WBC 4.0 - 10.5 K/uL 16.0   18.3   9.7    Hemoglobin 12.0 - 15.0 g/dL 12.7   13.7   14.8    Hematocrit  36.0 - 46.0 % 37.9   40.6   44.1    Platelets 150 - 400 K/uL 318   346   379        Latest Ref Rng & Units 10/03/2021    4:40 AM 10/02/2021    4:34 AM 10/01/2021    1:10 PM  CMP  Glucose 70 - 99 mg/dL 106   159   115    BUN 8 - 23 mg/dL '13   11   11    '$ Creatinine 0.44 - 1.00 mg/dL 0.76   0.68   0.66    Sodium 135 - 145 mmol/L 130   128   128    Potassium 3.5 - 5.1 mmol/L 3.2   3.3   3.6    Chloride 98 - 111 mmol/L 95   97   92    CO2 22 - 32 mmol/L '27   21   26    '$ Calcium 8.9 - 10.3 mg/dL 8.6   7.6   9.3    Total Protein 6.5 - 8.1 g/dL   7.3    Total Bilirubin 0.3 - 1.2 mg/dL   0.6    Alkaline Phos 38 - 126 U/L   59    AST 15 - 41 U/L   22    ALT 0 - 44 U/L   25       Imaging studies: No new pertinent imaging studies   Assessment/Plan:  76 y.o. female with  2 Days Post-Op s/p robotic small bowel resection for strangulated  incisional hernia, complicated by pertinent comorbidities including:  Patient Active Problem List   Diagnosis Date Noted   S/P small bowel resection 10/01/2021   Acute diverticulitis 03/10/2018   Diverticulitis of intestine with abscess 03/03/2018    -Discontinue NG tube, essentially NPO, await restoration of bowel function.  -IV fluids/correct K+.  ReCheck lytes in AM.   -Monitor leukocytosis, repeat CBC in AM.   -Continue DVT prophylaxis, progressive ambulation as able.  All of the above findings and recommendations were discussed with the patient, and all of patient's questions were answered to their expressed satisfaction.  -- Ronny Bacon, M.D., [facility] 10/03/2021

## 2021-10-03 NOTE — Progress Notes (Signed)
Mobility Specialist - Progress Note   10/03/21 1000  Mobility  Activity Ambulated with assistance in hallway  Level of Assistance Standby assist, set-up cues, supervision of patient - no hands on  Assistive Device None  Distance Ambulated (ft) 180 ft  Activity Response Tolerated well  $Mobility charge 1 Mobility    Pt lying in bed upon arrival, utilizing RA. Mild soreness at incision site during ambulation. Extra time needed to complete bed mobility. Pt ambulated in hallway with supervision, no other complaints. Pt left in bed with needs in reach.    Kathee Delton Mobility Specialist 10/03/21, 12:21 PM

## 2021-10-04 LAB — BASIC METABOLIC PANEL
Anion gap: 4 — ABNORMAL LOW (ref 5–15)
BUN: 12 mg/dL (ref 8–23)
CO2: 25 mmol/L (ref 22–32)
Calcium: 8 mg/dL — ABNORMAL LOW (ref 8.9–10.3)
Chloride: 106 mmol/L (ref 98–111)
Creatinine, Ser: 0.56 mg/dL (ref 0.44–1.00)
GFR, Estimated: >60 mL/min (ref >60)
Glucose, Bld: 121 mg/dL — ABNORMAL HIGH (ref 70–99)
Potassium: 4 mmol/L (ref 3.5–5.1)
Sodium: 135 mmol/L (ref 135–145)

## 2021-10-04 LAB — CBC
HCT: 37 % (ref 36.0–46.0)
Hemoglobin: 12.1 g/dL (ref 12.0–15.0)
MCH: 29.1 pg (ref 26.0–34.0)
MCHC: 32.7 g/dL (ref 30.0–36.0)
MCV: 88.9 fL (ref 80.0–100.0)
Platelets: 307 10*3/uL (ref 150–400)
RBC: 4.16 MIL/uL (ref 3.87–5.11)
RDW: 13.5 % (ref 11.5–15.5)
WBC: 14 10*3/uL — ABNORMAL HIGH (ref 4.0–10.5)
nRBC: 0 % (ref 0.0–0.2)

## 2021-10-04 NOTE — Plan of Care (Signed)

## 2021-10-04 NOTE — Progress Notes (Signed)
Subjective:  CC: Sharon Pittman is a 76 y.o. female  Hospital stay day 3, 3 Days Post-Op s/p robotic small bowel resection for strangulated incisional hernia,   HPI: No issues overnight.  Passing flatus and tolerating ice chips with no bloating, N/V.  ROS:  General: Denies weight loss, weight gain, fatigue, fevers, chills, and night sweats. Heart: Denies chest pain, palpitations, racing heart, irregular heartbeat, leg pain or swelling, and decreased activity tolerance. Respiratory: Denies breathing difficulty, shortness of breath, wheezing, cough, and sputum. GI: Denies change in appetite, heartburn, nausea, vomiting, constipation, diarrhea, and blood in stool. GU: Denies difficulty urinating, pain with urinating, urgency, frequency, blood in urine.   Objective:   Temp:  [98.2 F (36.8 C)-99.2 F (37.3 C)] 98.8 F (37.1 C) (05/27 0750) Pulse Rate:  [94-98] 97 (05/27 0750) Resp:  [18-20] 18 (05/27 0750) BP: (123-142)/(74-88) 123/79 (05/27 0750) SpO2:  [96 %-98 %] 96 % (05/27 0750)     Height: '5\' 3"'$  (160 cm) Weight: 60.3 kg BMI (Calculated): 23.57   Intake/Output this shift:   Intake/Output Summary (Last 24 hours) at 10/04/2021 1028 Last data filed at 10/04/2021 0400 Gross per 24 hour  Intake 535.66 ml  Output 300 ml  Net 235.66 ml    Constitutional :  alert, cooperative, appears stated age, and no distress  Respiratory:  clear to auscultation bilaterally  Cardiovascular:  regular rate and rhythm  Gastrointestinal: Soft, no guarding, minimal TTP around incision sites. Binder in place .   Skin: Cool and moist. Incisions with some irritation but no extensive erythema, induration to indicate infection  Psychiatric: Normal affect, non-agitated, not confused       LABS:     Latest Ref Rng & Units 10/04/2021    6:08 AM 10/03/2021    4:40 AM 10/02/2021    4:34 AM  CMP  Glucose 70 - 99 mg/dL 121   106   159    BUN 8 - 23 mg/dL '12   13   11    '$ Creatinine 0.44 - 1.00 mg/dL 0.56    0.76   0.68    Sodium 135 - 145 mmol/L 135   130   128    Potassium 3.5 - 5.1 mmol/L 4.0   3.2   3.3    Chloride 98 - 111 mmol/L 106   95   97    CO2 22 - 32 mmol/L '25   27   21    '$ Calcium 8.9 - 10.3 mg/dL 8.0   8.6   7.6        Latest Ref Rng & Units 10/04/2021    6:08 AM 10/03/2021    4:40 AM 10/02/2021    4:34 AM  CBC  WBC 4.0 - 10.5 K/uL 14.0   16.0   18.3    Hemoglobin 12.0 - 15.0 g/dL 12.1   12.7   13.7    Hematocrit 36.0 - 46.0 % 37.0   37.9   40.6    Platelets 150 - 400 K/uL 307   318   346      RADS: N/a Assessment:   s/p robotic small bowel resection for strangulated incisional hernia.  Passing flatus now.  Start clears.  Continue to monitor.  labs/images/medications/previous chart entries reviewed personally and relevant changes/updates noted above.

## 2021-10-05 NOTE — Progress Notes (Signed)
Subjective:  CC: Sharon Pittman is a 76 y.o. female  Hospital stay day 4, 4 Days Post-Op s/p robotic small bowel resection for strangulated incisional hernia,   HPI: No issues overnight.  Small BM recorded and advanced to full liquid  ROS:  General: Denies weight loss, weight gain, fatigue, fevers, chills, and night sweats. Heart: Denies chest pain, palpitations, racing heart, irregular heartbeat, leg pain or swelling, and decreased activity tolerance. Respiratory: Denies breathing difficulty, shortness of breath, wheezing, cough, and sputum. GI: Denies change in appetite, heartburn, nausea, vomiting, constipation, diarrhea, and blood in stool. GU: Denies difficulty urinating, pain with urinating, urgency, frequency, blood in urine.   Objective:   Temp:  [97.6 F (36.4 C)-99 F (37.2 C)] 98.2 F (36.8 C) (05/28 0816) Pulse Rate:  [86-104] 88 (05/28 0816) Resp:  [16-20] 18 (05/28 0816) BP: (130-158)/(81-96) 130/81 (05/28 0816) SpO2:  [100 %] 100 % (05/28 0816)     Height: '5\' 3"'$  (160 cm) Weight: 60.3 kg BMI (Calculated): 23.57   Intake/Output this shift:   Intake/Output Summary (Last 24 hours) at 10/05/2021 0951 Last data filed at 10/05/2021 3536 Gross per 24 hour  Intake 2946.49 ml  Output 800 ml  Net 2146.49 ml    Constitutional :  alert, cooperative, appears stated age, and no distress  Respiratory:  clear to auscultation bilaterally  Cardiovascular:  regular rate and rhythm  Gastrointestinal: Soft, no guarding, minimal TTP around incision sites. Binder in place .   Skin: Cool and moist. Incisions with some irritation but no extensive erythema, induration to indicate infection  Psychiatric: Normal affect, non-agitated, not confused       LABS:     Latest Ref Rng & Units 10/04/2021    6:08 AM 10/03/2021    4:40 AM 10/02/2021    4:34 AM  CMP  Glucose 70 - 99 mg/dL 121   106   159    BUN 8 - 23 mg/dL '12   13   11    '$ Creatinine 0.44 - 1.00 mg/dL 0.56   0.76   0.68     Sodium 135 - 145 mmol/L 135   130   128    Potassium 3.5 - 5.1 mmol/L 4.0   3.2   3.3    Chloride 98 - 111 mmol/L 106   95   97    CO2 22 - 32 mmol/L '25   27   21    '$ Calcium 8.9 - 10.3 mg/dL 8.0   8.6   7.6        Latest Ref Rng & Units 10/04/2021    6:08 AM 10/03/2021    4:40 AM 10/02/2021    4:34 AM  CBC  WBC 4.0 - 10.5 K/uL 14.0   16.0   18.3    Hemoglobin 12.0 - 15.0 g/dL 12.1   12.7   13.7    Hematocrit 36.0 - 46.0 % 37.0   37.9   40.6    Platelets 150 - 400 K/uL 307   318   346      RADS: N/a Assessment:   s/p robotic small bowel resection for strangulated incisional hernia.  BM noted.  Up to full liquid now. Continue to monitor. If additional BM, may consider advancing to regular for dinner  Hold lovenox for slow downtrend of hgb.  Likely dilutional but repeat in am.  No clinical sign of bleed.  labs/images/medications/previous chart entries reviewed personally and relevant changes/updates noted above.

## 2021-10-06 LAB — CBC
HCT: 38.6 % (ref 36.0–46.0)
Hemoglobin: 12.8 g/dL (ref 12.0–15.0)
MCH: 28.6 pg (ref 26.0–34.0)
MCHC: 33.2 g/dL (ref 30.0–36.0)
MCV: 86.2 fL (ref 80.0–100.0)
Platelets: 371 10*3/uL (ref 150–400)
RBC: 4.48 MIL/uL (ref 3.87–5.11)
RDW: 12.9 % (ref 11.5–15.5)
WBC: 10.3 10*3/uL (ref 4.0–10.5)
nRBC: 0 % (ref 0.0–0.2)

## 2021-10-06 MED ORDER — HYDROCODONE-ACETAMINOPHEN 5-325 MG PO TABS: 1.0000 | ORAL_TABLET | Freq: Four times a day (QID) | ORAL | 0 refills | Status: DC | PRN

## 2021-10-06 MED ORDER — ADULT MULTIVITAMIN W/MINERALS CH
1.0000 | ORAL_TABLET | Freq: Every day | ORAL | Status: DC
  Administered 2021-10-06: 1 via ORAL
  Filled 2021-10-06: qty 1

## 2021-10-06 MED ORDER — ENSURE ENLIVE PO LIQD
237.0000 mL | Freq: Two times a day (BID) | ORAL | Status: DC
  Administered 2021-10-06: 237 mL via ORAL

## 2021-10-06 MED ORDER — IBUPROFEN 800 MG PO TABS: 800.0000 mg | ORAL_TABLET | Freq: Three times a day (TID) | ORAL | 0 refills | Status: DC | PRN

## 2021-10-06 NOTE — Progress Notes (Signed)
Nutrition Follow-up  DOCUMENTATION CODES:   Not applicable  INTERVENTION:   -Ensure Enlive po BID, each supplement provides 350 kcal and 20 grams of protein -MVI with minerals daily  NUTRITION DIAGNOSIS:   Inadequate oral intake related to altered GI function as evidenced by NPO status.  Progressing; advanced to regular diet on 10/05/21  GOAL:   Patient will meet greater than or equal to 90% of their needs  Progressing   MONITOR:   PO intake, Supplement acceptance  REASON FOR ASSESSMENT:   Malnutrition Screening Tool    ASSESSMENT:   Pt admitted with postoperative lower abdominal pain with vomiting. Found to have SBO secondary to incarcerated incisional hernia in R lower quadrant PMH significant for recent laparoscopic assisted GYN procedures 9 days ago.  5/24- s/p small bowel resection and anterior abdominal wall hernia repair 5/26- NGT d/c 5/27- advanced to clear liquid diet 5/28- advanced to full liquid diet, advanced to regular diet  Reviewed I/O's: +2.1 L x 24 hours and +4.2 L since admission  Pt out of room at time of visit.   Pt with good oral intake. Noted meal completions 75-100%.    Medications reviewed and include calcium carbonate.   Labs reviewed.   Diet Order:   Diet Order             Diet regular Room service appropriate? Yes; Fluid consistency: Thin  Diet effective now                   EDUCATION NEEDS:   Education needs have been addressed  Skin:  Skin Assessment: Skin Integrity Issues: Skin Integrity Issues:: Incisions Incisions: abdomen (closed)  Last BM:  10/05/21 (type 7)  Height:   Ht Readings from Last 1 Encounters:  10/01/21 '5\' 3"'$  (1.6 m)    Weight:   Wt Readings from Last 1 Encounters:  10/01/21 60.3 kg   BMI:  Body mass index is 23.56 kg/m.  Estimated Nutritional Needs:   Kcal:  1700-1900  Protein:  85-100g  Fluid:  >/=1.7L    Loistine Chance, RD, LDN, [place] Registered Dietitian II Certified Diabetes  Care and Education Specialist Please refer to [facility] for RD and/or RD on-call/weekend/after hours pager

## 2021-10-06 NOTE — Discharge Summary (Signed)
Physician Discharge Summary  Patient ID: GAYL IVANOFF MRN: [id number] DOB/AGE: 1945-05-23 76 y.o.  Admit date: 10/01/2021 Discharge date: 10/06/2021  Admission Diagnoses:  Discharge Diagnoses:  Principal Problem:   S/P small bowel resection   Discharged Condition: good  Hospital Course: Admitted via ED for emergent Robotic Small bowel resection for strangulated acute incisional hernia.  Post op course, NGT removal followed by mild distention, which resolved overnight.  Progression to regular diet.  Pain well controlled.  Ambulating well.   Consults: None  Significant Diagnostic Studies: radiology: CT scan: see report.   Treatments: surgery: as noted above.   Discharge Exam: Blood pressure 136/83, pulse 87, temperature 98.8 F (37.1 C), temperature source Oral, resp. rate 18, height '5\' 3"'$  (1.6 m), weight 60.3 kg, SpO2 97 %. General appearance: alert, cooperative, and no distress Resp: clear to auscultation bilaterally Cardio: regular rate and rhythm GI: soft, non-tender; bowel sounds normal; no masses,  no organomegaly Incision/Wound: c/d/I, without masses or tenderness.   Disposition: Discharge disposition: 01-Home or Self Care       Discharge Instructions     Call MD for:  persistant nausea and vomiting   Complete by: As directed    Call MD for:  redness, tenderness, or signs of infection (pain, swelling, redness, odor or green/yellow discharge around incision site)   Complete by: As directed    Call MD for:  severe uncontrolled pain   Complete by: As directed    Diet - low sodium heart healthy   Complete by: As directed    Discharge wound care:   Complete by: As directed    Your incision was closed with Dermabond.  It is best to keep it clean and dry, it will tolerate a brief shower, but do not soak it or apply any creams or lotions to the incisions.  The Dermabond should gradually flake off over time.  Keep it open to air so you can evaluate your incisions.   Dermabond assists the underlying sutures to keep your incision closed and protected from infection.  Should you develop some drainage from your incision, some drops of drainage would be okay but if it persists continue to put keep a dry dressing over it.   Driving Restrictions   Complete by: As directed    No driving until cleared after follow-up appointment.  Is not advised to drive while taking narcotic pain medications or in significant pain.   Increase activity slowly   Complete by: As directed    Lifting restrictions   Complete by: As directed    Strongly advised against any form of lifting greater than 15 pounds over the next 4 to 6 weeks.  This involves pushing/pulling movements as well.  After 4 weeks when may gradually engage in more activities remaining aware of any new pain/tenderness elicited, and avoiding those for the full duration of 6 weeks.  Walking is encouraged.  Climbing stairs with caution.      Allergies as of 10/06/2021       Reactions   Ace Inhibitors Cough        Medication List     TAKE these medications    ALPRAZolam 0.25 MG tablet Commonly known as: XANAX Take 0.25 mg by mouth at bedtime as needed for anxiety.   calcium carbonate 1500 (600 Ca) MG Tabs tablet Commonly known as: OSCAL Take 1 tablet by mouth daily with breakfast.   chlorthalidone 25 MG tablet Commonly known as: HYGROTON Take 25 mg by mouth 2 (  two) times a week.   HYDROcodone-acetaminophen 5-325 MG tablet Commonly known as: NORCO/VICODIN Take 1 tablet by mouth every 6 (six) hours as needed for moderate pain.   ibuprofen 800 MG tablet Commonly known as: ADVIL Take 1 tablet (800 mg total) by mouth every 8 (eight) hours as needed.   mometasone 50 MCG/ACT nasal spray Commonly known as: NASONEX Place 2 sprays into the nose daily.   pantoprazole 20 MG tablet Commonly known as: PROTONIX Take 1 tablet (20 mg total) by mouth daily for 20 days.   Probiotic Blend Caps Take 1 tablet by  mouth daily.               Discharge Care Instructions  (From admission, onward)           Start     Ordered   10/06/21 0000  Discharge wound care:       Comments: Your incision was closed with Dermabond.  It is best to keep it clean and dry, it will tolerate a brief shower, but do not soak it or apply any creams or lotions to the incisions.  The Dermabond should gradually flake off over time.  Keep it open to air so you can evaluate your incisions.  Dermabond assists the underlying sutures to keep your incision closed and protected from infection.  Should you develop some drainage from your incision, some drops of drainage would be okay but if it persists continue to put keep a dry dressing over it.   10/06/21 1206            Follow-up Information     Tylene Fantasia, PA-C. Schedule an appointment as soon as possible for a visit in 2 week(s).   Specialty: Physician Assistant Contact information: [address] [place] [address] [phone]                 Signed: Ronny Bacon, M.D., [facility] 10/06/2021, 12:06 PM

## 2021-10-06 NOTE — Care Management Important Message (Signed)
Important Message  Patient Details  Name: Sharon Pittman MRN: [id number] Date of Birth: 1945-05-24   Medicare Important Message Given:  Yes     Dannette Barbara 10/06/2021, 10:52 AM

## 2021-10-07 NOTE — Anesthesia Postprocedure Evaluation (Signed)
Anesthesia Post Note  Patient: Sharon Pittman  Procedure(s) Performed: XI ROBOT ASSISTED Small bowel resection (Abdomen) repair anterior wall hernias 3.5cm (Abdomen) INDOCYANINE GREEN FLUORESCENCE IMAGING (ICG)  Patient location during evaluation: PACU Anesthesia Type: General Level of consciousness: awake and alert Pain management: pain level controlled Vital Signs Assessment: post-procedure vital signs reviewed and stable Respiratory status: spontaneous breathing, nonlabored ventilation and respiratory function stable Cardiovascular status: blood pressure returned to baseline and stable Postop Assessment: no apparent nausea or vomiting Anesthetic complications: no   No notable events documented.   Last Vitals:  Vitals:   10/06/21 0617 10/06/21 0802  BP: 130/74 136/83  Pulse: 87 87  Resp: 17 18  Temp: 36.9 C 37.1 C  SpO2: 97% 97%    Last Pain:  Vitals:   10/06/21 0900  TempSrc:   PainSc: 0-No pain                 [place] Ouch

## 2021-10-14 ENCOUNTER — Encounter: Payer: Self-pay | Admitting: Physician Assistant

## 2021-10-14 ENCOUNTER — Ambulatory Visit (INDEPENDENT_AMBULATORY_CARE_PROVIDER_SITE_OTHER): Payer: Medicare Other | Admitting: Physician Assistant

## 2021-10-14 VITALS — BP 161/83 | HR 88 | Temp 97.9°F | Wt 131.2 lb

## 2021-10-14 DIAGNOSIS — Z9049 Acquired absence of other specified parts of digestive tract: Secondary | ICD-10-CM

## 2021-10-14 DIAGNOSIS — K56609 Unspecified intestinal obstruction, unspecified as to partial versus complete obstruction: Secondary | ICD-10-CM

## 2021-10-14 DIAGNOSIS — Z09 Encounter for follow-up examination after completed treatment for conditions other than malignant neoplasm: Secondary | ICD-10-CM

## 2021-10-14 NOTE — Patient Instructions (Signed)
If you have any concerns or questions, please feel free to call our office.    GENERAL POST-OPERATIVE PATIENT INSTRUCTIONS   WOUND CARE INSTRUCTIONS:  Keep a dry clean dressing on the wound if there is drainage. The initial bandage may be removed after 24 hours.  Once the wound has quit draining you may leave it open to air.  If clothing rubs against the wound or causes irritation and the wound is not draining you may cover it with a dry dressing during the daytime.  Try to keep the wound dry and avoid ointments on the wound unless directed to do so.  If the wound becomes bright red and painful or starts to drain infected material that is not clear, please contact your physician immediately.  If the wound is mildly pink and has a thick firm ridge underneath it, this is normal, and is referred to as a healing ridge.  This will resolve over the next 4-6 weeks.  BATHING: You may shower if you have been informed of this by your surgeon. However, Please do not submerge in a tub, hot tub, or pool until incisions are completely sealed or have been told by your surgeon that you may do so.  DIET:  You may eat any foods that you can tolerate.  It is a good idea to eat a high fiber diet and take in plenty of fluids to prevent constipation.  If you do become constipated you may want to take a mild laxative or take ducolax tablets on a daily basis until your bowel habits are regular.  Constipation can be very uncomfortable, along with straining, after recent surgery.  ACTIVITY:  You are encouraged to cough and deep breath or use your incentive spirometer if you were given one, every 15-30 minutes when awake.  This will help prevent respiratory complications and low grade fevers post-operatively if you had a general anesthetic.  You may want to hug a pillow when coughing and sneezing to add additional support to the surgical area, if you had abdominal or chest surgery, which will decrease pain during these times.   You are encouraged to walk and engage in light activity for the next two weeks.  You should not lift more than 20 pounds for 6 weeks total after surgery as it could put you at increased risk for complications.  Twenty pounds is roughly equivalent to a plastic bag of groceries. At that time- Listen to your body when lifting, if you have pain when lifting, stop and then try again in a few days. Soreness after doing exercises or activities of daily living is normal as you get back in to your normal routine.  MEDICATIONS:  Try to take narcotic medications and anti-inflammatory medications, such as tylenol, ibuprofen, naprosyn, etc., with food.  This will minimize stomach upset from the medication.  Should you develop nausea and vomiting from the pain medication, or develop a rash, please discontinue the medication and contact your physician.  You should not drive, make important decisions, or operate machinery when taking narcotic pain medication.  SUNBLOCK Use sun block to incision area over the next year if this area will be exposed to sun. This helps decrease scarring and will allow you avoid a permanent darkened area over your incision.  QUESTIONS:  Please feel free to call our office if you have any questions, and we will be glad to assist you. [phone]    Diagnostic Laparoscopy, Care After The following information offers guidance on how  to care for yourself after your procedure. Your health care provider may also give you more specific instructions. If you have problems or questions, contact your health care provider. What can I expect after the procedure? After the procedure, it is common to have: Mild discomfort in the abdomen. Sore throat. Women who have laparoscopy with a pelvic examination may have mild cramping and fluid coming from the vagina for a few days after the procedure. Follow these instructions at home: Medicines Take over-the-counter and prescription medicines only as  told by your health care provider. If you were prescribed an antibiotic medicine, take it as told by your health care provider. Do not stop taking the antibiotic even if you start to feel better. Ask your health care provider if the medicine prescribed to you: Requires you to avoid driving or using machinery. Can cause constipation. You may need to take these actions to prevent or treat constipation: Drink enough fluid to keep your urine pale yellow. Take over-the-counter or prescription medicines. Eat foods that are high in fiber, such as beans, whole grains, and fresh fruits and vegetables. Limit foods that are high in fat and processed sugars, such as fried or sweet foods. Incision care  Follow instructions from your health care provider about how to take care of your incisions. Make sure you: Wash your hands with soap and water for at least 20 seconds before and after you change your bandage (dressing). If soap and water are not available, use hand sanitizer. Change your dressing as told by your health care provider. Leave stitches (sutures), skin glue, or surgical tape in place. These skin closures may need to stay in place for 2 weeks or longer. If surgical tape edges start to loosen and curl up, you may trim the loose edges. Do not remove the surgical tape completely unless your health care provider tells you to do that. Check your incision areas every day for signs of infection. Check for: Redness, swelling, or pain. Fluid or blood. Warmth. Pus or a bad smell. Activity Return to your normal activities as told by your health care provider. Ask your health care provider what activities are safe for you. Do not lift anything that is heavier than 10 lb (4.5 kg), or the limit that you are told, until your health care provider says that it is safe. Avoid sitting for a long time without moving. Get up to take short walks every 1-2 hours. This is important to improve blood flow and breathing.  Ask for help if you feel weak or unsteady. General instructions Do not use any products that contain nicotine or tobacco. These products include cigarettes, chewing tobacco, and vaping devices, such as e-cigarettes. If you need help quitting, ask your health care provider. If you were given a sedative during the procedure, it can affect you for several hours. Do not drive or operate machinery until your health care provider says that it is safe. Do not take baths, swim, or use a hot tub until your health care provider approves. Ask your health care provider if you may take showers. You may only be allowed to take sponge baths. Keep all follow-up visits. This is important. Contact a health care provider if: You develop shoulder pain. You feel light-headed or faint. You are unable to pass gas or have a bowel movement. You feel nauseous or you vomit. You develop a rash. You have any of these signs of infection: Redness, swelling, or pain around an incision. Fluid or blood  coming from an incision. Warmth coming from an incision. Pus or a bad smell coming from an incision. A fever or chills. Get help right away if: You have severe pain. You have vomiting that does not go away. You have heavy bleeding from the vagina. Any incision opens up. You have trouble breathing. You have chest pain. These symptoms may represent a serious problem that is an emergency. Do not wait to see if the symptoms will go away. Get medical help right away. Call your local emergency services (911 in the [place] Do not drive yourself to the hospital. Summary After the procedure, it is common to have mild discomfort in the abdomen and a sore throat. Check your incision areas every day for signs of infection. Return to your normal activities as told by your health care provider. Ask your health care provider what activities are safe for you. This information is not intended to replace advice given to you by your health  care provider. Make sure you discuss any questions you have with your health care provider. Document Revised: 12/22/2019 Document Reviewed: 12/22/2019 Elsevier Patient Education  [place].

## 2021-10-14 NOTE — Progress Notes (Signed)
[facility] POST-OP OFFICE VISIT  10/14/2021  HPI: Sharon Pittman is a 76 y.o. female 13 days s/p robotic assisted laparoscopic small bowel resection with anastomosis and anterior abdominal wall hernia repair (defect 3.5 cm) with Dr Christian Mate   She has done remarkably well since her surgery and given the magnitude She has had almost no pain and has been pain free since she has been home No fever, chills, nausea, emesis She had one episode of constipation but since then has been moving her bowel regularly Continues to use abdominal binder No issues with incisions No other complaints  Vital signs: BP (!) 161/83   Pulse 88   Temp 97.9 F (36.6 C) (Oral)   Wt 131 lb 3.2 oz (59.5 kg)   SpO2 98%   BMI 23.24 kg/m    Physical Exam: Constitutional: Well appearing female, NAD Abdomen: Soft, non-tender, non-distended, no rebound/guarding Skin: Laparoscopic incisions are healing well, no erythema or drainage   Assessment/Plan: This is a 76 y.o. female 13 days s/p robotic assisted laparoscopic small bowel resection with anastomosis and anterior abdominal wall hernia repair (defect 3.5 cm)   - Pain control prn  - Reviewed wound care recommendation  - Reviewed lifting restrictions; 6 weeks total  - She can follow up on as needed basis; She understands to call with questions/concerns  -- Edison Simon, PA-C [facility] 10/14/2021, 3:43 PM M-F: 7am - 4pm

## 2021-11-03 ENCOUNTER — Other Ambulatory Visit: Payer: Self-pay | Admitting: Internal Medicine

## 2021-11-03 DIAGNOSIS — Z1231 Encounter for screening mammogram for malignant neoplasm of breast: Secondary | ICD-10-CM

## 2021-12-15 ENCOUNTER — Ambulatory Visit
Admission: RE | Admit: 2021-12-15 | Discharge: 2021-12-15 | Disposition: A | Discharge status: Home / Self Care | Payer: Medicare Other | Source: Ambulatory Visit | Attending: Internal Medicine | Admitting: Internal Medicine

## 2021-12-15 DIAGNOSIS — Z1231 Encounter for screening mammogram for malignant neoplasm of breast: Secondary | ICD-10-CM | POA: Diagnosis present

## 2022-11-09 ENCOUNTER — Other Ambulatory Visit: Payer: Self-pay | Admitting: Internal Medicine

## 2022-11-09 DIAGNOSIS — Z1231 Encounter for screening mammogram for malignant neoplasm of breast: Secondary | ICD-10-CM

## 2022-12-17 ENCOUNTER — Ambulatory Visit
Admission: RE | Admit: 2022-12-17 | Discharge: 2022-12-17 | Disposition: A | Discharge status: Home / Self Care | Payer: Medicare Other | Source: Ambulatory Visit | Attending: Internal Medicine | Admitting: Internal Medicine

## 2022-12-17 DIAGNOSIS — Z1231 Encounter for screening mammogram for malignant neoplasm of breast: Secondary | ICD-10-CM | POA: Insufficient documentation

## 2023-11-11 ENCOUNTER — Other Ambulatory Visit: Payer: Self-pay | Admitting: Internal Medicine

## 2023-11-11 DIAGNOSIS — Z1231 Encounter for screening mammogram for malignant neoplasm of breast: Secondary | ICD-10-CM

## 2023-12-21 ENCOUNTER — Ambulatory Visit
Admission: RE | Admit: 2023-12-21 | Discharge: 2023-12-21 | Disposition: A | Discharge status: Home / Self Care | Source: Ambulatory Visit | Attending: Internal Medicine | Admitting: Internal Medicine

## 2023-12-21 DIAGNOSIS — Z1231 Encounter for screening mammogram for malignant neoplasm of breast: Secondary | ICD-10-CM | POA: Insufficient documentation

## 2023-12-24 ENCOUNTER — Other Ambulatory Visit: Payer: Self-pay | Admitting: Internal Medicine

## 2023-12-24 DIAGNOSIS — R928 Other abnormal and inconclusive findings on diagnostic imaging of breast: Secondary | ICD-10-CM

## 2023-12-27 ENCOUNTER — Ambulatory Visit
Admission: RE | Admit: 2023-12-27 | Discharge: 2023-12-27 | Disposition: A | Discharge status: Home / Self Care | Source: Ambulatory Visit | Attending: Internal Medicine | Admitting: Internal Medicine

## 2023-12-27 DIAGNOSIS — R928 Other abnormal and inconclusive findings on diagnostic imaging of breast: Secondary | ICD-10-CM | POA: Diagnosis present

## 2024-02-15 ENCOUNTER — Other Ambulatory Visit: Payer: Self-pay

## 2024-02-15 ENCOUNTER — Emergency Department

## 2024-02-15 ENCOUNTER — Emergency Department
Admission: EM | Admit: 2024-02-15 | Discharge: 2024-02-15 | Disposition: A | Discharge status: Home / Self Care | Attending: Emergency Medicine | Admitting: Emergency Medicine

## 2024-02-15 DIAGNOSIS — W109XXA Fall (on) (from) unspecified stairs and steps, initial encounter: Secondary | ICD-10-CM | POA: Diagnosis not present

## 2024-02-15 DIAGNOSIS — S6992XA Unspecified injury of left wrist, hand and finger(s), initial encounter: Secondary | ICD-10-CM | POA: Diagnosis present

## 2024-02-15 DIAGNOSIS — S52572A Other intraarticular fracture of lower end of left radius, initial encounter for closed fracture: Secondary | ICD-10-CM | POA: Insufficient documentation

## 2024-02-15 DIAGNOSIS — S62102A Fracture of unspecified carpal bone, left wrist, initial encounter for closed fracture: Secondary | ICD-10-CM

## 2024-02-15 DIAGNOSIS — Y9301 Activity, walking, marching and hiking: Secondary | ICD-10-CM | POA: Diagnosis not present

## 2024-02-15 DIAGNOSIS — M25532 Pain in left wrist: Secondary | ICD-10-CM | POA: Diagnosis present

## 2024-02-15 DIAGNOSIS — W19XXXA Unspecified fall, initial encounter: Secondary | ICD-10-CM

## 2024-02-15 MED ORDER — TRAMADOL HCL 50 MG PO TABS: 50.0000 mg | ORAL_TABLET | Freq: Four times a day (QID) | ORAL | 0 refills | Status: AC | PRN

## 2024-02-15 MED ORDER — ACETAMINOPHEN 325 MG PO TABS
650.0000 mg | ORAL_TABLET | Freq: Once | ORAL | Status: AC
  Administered 2024-02-15: 650 mg via ORAL
  Filled 2024-02-15: qty 2

## 2024-02-15 NOTE — ED Triage Notes (Signed)
 Patient states she was walking up the steps and fell, complaining of pain to left wrist. Denies hitting head.

## 2024-02-15 NOTE — ED Provider Notes (Signed)
 [facility] Provider Note    Event Date/Time   First MD Initiated Contact with Patient 02/15/24 [id number]     (approximate)   History   Wrist Pain   HPI  Sharon Pittman is a 78 y.o. female right-hand-dominant presents today for evaluation of left wrist pain.  Patient reports that she tripped walking up the steps and caught herself with her out stretched left hand and now has pain to her left wrist.  She denies head strike or LOC.  No paresthesias.  Patient Active Problem List   Diagnosis Date Noted   S/P small bowel resection 10/01/2021   Acute diverticulitis 03/10/2018   Diverticulitis of intestine with abscess 03/03/2018          Physical Exam   Triage Vital Signs: ED Triage Vitals  Encounter Vitals Group     BP 02/15/24 0734 (!) 156/74     Girls Systolic BP Percentile --      Girls Diastolic BP Percentile --      Boys Systolic BP Percentile --      Boys Diastolic BP Percentile --      Pulse Rate 02/15/24 0734 61     Resp 02/15/24 0734 18     Temp 02/15/24 0734 97.8 F (36.6 C)     Temp Source 02/15/24 0734 Oral     SpO2 02/15/24 0734 98 %     Weight 02/15/24 0733 132 lb (59.9 kg)     Height 02/15/24 0733 5' 3 (1.6 m)     Head Circumference --      Peak Flow --      Pain Score 02/15/24 0733 10     Pain Loc --      Pain Education --      Exclude from Growth Chart --     Most recent vital signs: Vitals:   02/15/24 0734  BP: (!) 156/74  Pulse: 61  Resp: 18  Temp: 97.8 F (36.6 C)  SpO2: 98%    Physical Exam Vitals and nursing note reviewed.  Constitutional:      General: Awake and alert. No acute distress.    Appearance: Normal appearance. The patient is normal weight.  HENT:     Head: Normocephalic and atraumatic.     Mouth: Mucous membranes are moist.  Eyes:     General: PERRL. Normal EOMs        Right eye: No discharge.        Left eye: No discharge.     Conjunctiva/sclera: Conjunctivae normal.  Cardiovascular:      Rate and Rhythm: Normal rate and regular rhythm.     Pulses: Normal pulses.  Pulmonary:     Effort: Pulmonary effort is normal. No respiratory distress.     Breath sounds: Normal breath sounds.  Abdominal:     Abdomen is soft. There is no abdominal tenderness. No rebound or guarding. No distention. Musculoskeletal:        General: No swelling. Normal range of motion.     Cervical back: Normal range of motion and neck supple.  Left wrist: Swelling noted to the dorsum of the wrist with tenderness to palpation.  No open wounds.  Normal radial pulse.  Able to move all fingers.  No tenderness to proximal forearm, elbow, humerus, or shoulder.  Sensation intact to light touch throughout fingers, hand, and rest of arm. Skin:    General: Skin is warm and dry.     Capillary Refill: Capillary refill  takes less than 2 seconds.     Findings: No rash.  Neurological:     Mental Status: The patient is awake and alert.      ED Results / Procedures / Treatments   Labs (all labs ordered are listed, but only abnormal results are displayed) Labs Reviewed - No data to display   EKG     RADIOLOGY I independently reviewed and interpreted imaging and agree with radiologists findings.     PROCEDURES:  Critical Care performed:   Procedures   MEDICATIONS ORDERED IN ED: Medications  acetaminophen  (TYLENOL ) tablet 650 mg (650 mg Oral Given 02/15/24 0820)     IMPRESSION / MDM / ASSESSMENT AND PLAN / ED COURSE  I reviewed the triage vital signs and the nursing notes.   Differential diagnosis includes, but is not limited to, wrist fracture, sprain, contusion.  Patient is awake and alert, hemodynamically stable and afebrile.  She is neurovascularly intact, normal radial pulse, sensation intact light touch throughout fingers and arm.  No other injury sustained.  X-ray obtained reveals a distal radius fracture.  I discussed this with orthopedics who agrees with splinting and outpatient  follow-up.  Patient and her daughter are also in agreement with plan.    Splint was placed by ER technician.  Appropriate follow-up information was provided.  Patient was given a prescription for tramadol though advised that this medication is highly addictive and should only be used for breakthrough pain.  She was also advised that this medication can increase her fall risk, and that she should not drive, operate heavy machinery, or perform any tasks that require concentration while taking this medication.  We also discussed return precautions.  Patient understands and agrees with plan.  She was discharged in stable condition.   Patient's presentation is most consistent with acute complicated illness / injury requiring diagnostic workup.   Clinical Course as of 02/15/24 0856  Tue Feb 15, 2024  0754 Discussed with orthopedics, plan for splinting and outpatient follow-up. [JP]    Clinical Course User Index [JP] Kharter Sestak E, PA-C     FINAL CLINICAL IMPRESSION(S) / ED DIAGNOSES   Final diagnoses:  Closed fracture of left wrist, initial encounter  Fall, initial encounter     Rx / DC Orders   ED Discharge Orders          Ordered    traMADol (ULTRAM) 50 MG tablet  Every 6 hours PRN        02/15/24 0811             Note:  This document was prepared using Dragon voice recognition software and may include unintentional dictation errors.   Shantel Helwig E, PA-C 02/15/24 [id number]    Arlander Charleston, MD 02/15/24 [phone]

## 2024-02-15 NOTE — Discharge Instructions (Addendum)
 Please follow-up with orthopedics.  Please keep your splint clean and dry.  Please return for any new, worsening, or changing symptoms or other concerns.  You may take the medication as prescribed to help with pain, though remember that this medication can be highly addictive and should only be used for breakthrough pain.  Do not take with other sedating medications.  Please take Tylenol  and ibuprofen  per package instructions first.  Keep your arm elevated is much as you can.  It was a pleasure caring for you today.

## 2024-02-15 NOTE — ED Notes (Signed)
 See triage note  Presents s/p fall   States she fell up the steps  landed on wrist   Pain is in left wrist with some deformity  Good pulses
